# Patient Record
Sex: Female | Born: 2000 | Race: White | Hispanic: No | Marital: Single | Smoking: Current every day smoker
Health system: Southern US, Community
[De-identification: ages and names within clinical notes are randomized; demographics above are authoritative.]

## PROBLEM LIST (undated history)

## (undated) ENCOUNTER — Inpatient Hospital Stay (HOSPITAL_COMMUNITY): Payer: Self-pay

## (undated) DIAGNOSIS — F639 Impulse disorder, unspecified: Secondary | ICD-10-CM

## (undated) DIAGNOSIS — F1310 Sedative, hypnotic or anxiolytic abuse, uncomplicated: Secondary | ICD-10-CM

## (undated) DIAGNOSIS — F919 Conduct disorder, unspecified: Secondary | ICD-10-CM

## (undated) DIAGNOSIS — K219 Gastro-esophageal reflux disease without esophagitis: Secondary | ICD-10-CM

## (undated) DIAGNOSIS — R569 Unspecified convulsions: Secondary | ICD-10-CM

## (undated) DIAGNOSIS — R1110 Vomiting, unspecified: Secondary | ICD-10-CM

## (undated) DIAGNOSIS — A749 Chlamydial infection, unspecified: Secondary | ICD-10-CM

## (undated) DIAGNOSIS — F1210 Cannabis abuse, uncomplicated: Secondary | ICD-10-CM

## (undated) DIAGNOSIS — A549 Gonococcal infection, unspecified: Secondary | ICD-10-CM

## (undated) HISTORY — PX: OTHER SURGICAL HISTORY: SHX169

---
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
## 2018-01-14 ENCOUNTER — Encounter: Payer: Self-pay | Admitting: Student

## 2018-01-21 ENCOUNTER — Encounter: Payer: Self-pay | Admitting: Certified Nurse Midwife

## 2018-01-21 ENCOUNTER — Ambulatory Visit (INDEPENDENT_AMBULATORY_CARE_PROVIDER_SITE_OTHER): Payer: Medicaid Other | Admitting: Certified Nurse Midwife

## 2018-01-21 DIAGNOSIS — Z3483 Encounter for supervision of other normal pregnancy, third trimester: Secondary | ICD-10-CM

## 2018-01-21 DIAGNOSIS — Z3480 Encounter for supervision of other normal pregnancy, unspecified trimester: Secondary | ICD-10-CM

## 2018-01-21 NOTE — Progress Notes (Signed)
   PRENATAL VISIT NOTE  Subjective:  [name] is a 17 y.o. G2P1001 at 9647w3d being seen today for ongoing prenatal care.  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  Patient reports no complaints.  Contractions: Not present. Vag. Bleeding: None.  Movement: Present. Denies leaking of fluid.   The following portions of the patient's history were reviewed and updated as appropriate: allergies, current medications, past family history, past medical history, past social history, past surgical history and problem list. Problem list updated.  Objective:   Vitals:   01/21/18 1456 01/21/18 1502  BP: (!) 143/90 125/77  Pulse: 90 (!) 103  Weight: 163 lb (73.9 kg)     Fetal Status: Fetal Heart Rate (bpm): 143 Fundal Height: 31 cm Movement: Present     General:  Alert, oriented and cooperative. Patient is in no acute distress.  Skin: Skin is warm and dry. No rash noted.   Cardiovascular: Normal heart rate noted  Respiratory: Normal respiratory effort, no problems with respiration noted  Abdomen: Soft, gravid, appropriate for gestational age.  Pain/Pressure: Absent     Pelvic: Cervical exam deferred        Extremities: Normal range of motion.  Edema: None  Mental Status:  Normal mood and affect. Normal behavior. Normal judgment and thought content.   Assessment and Plan:  Pregnancy: G2P1001 at 7447w3d  1. Supervision of other normal pregnancy, antepartum -Pt doing well with no complaints  -Anticipatory guidance on next prenatal visits  -Answered patient's questions on growth and weight. Discussed that baby  is measuring appropriately and no follow up US is needed at this time.   Preterm labor symptoms and general obstetric precautions including but not limited to vaginal bleeding, contractions, leaking of fluid and fetal movement were reviewed in detail with the patient. Please refer to After Visit Summary for other counseling recommendations.  Return in about 2 weeks (around 02/04/2018) for ROB.   Sharyon CableVeronica C Valaria Kohut, CNM

## 2018-01-21 NOTE — Patient Instructions (Signed)

## 2018-01-28 ENCOUNTER — Ambulatory Visit (INDEPENDENT_AMBULATORY_CARE_PROVIDER_SITE_OTHER): Payer: Medicaid Other | Admitting: Obstetrics and Gynecology

## 2018-01-28 VITALS — BP 117/73 | HR 109 | Wt 167.1 lb

## 2018-01-28 DIAGNOSIS — Z3483 Encounter for supervision of other normal pregnancy, third trimester: Secondary | ICD-10-CM

## 2018-01-28 DIAGNOSIS — Z3480 Encounter for supervision of other normal pregnancy, unspecified trimester: Secondary | ICD-10-CM

## 2018-01-28 NOTE — Progress Notes (Signed)
   PRENATAL VISIT NOTE  Subjective:  Burman BlacksmithCheyenne Nicole Pasquarello is a 17 y.o. G2P1001 at 6518w3d being seen today for ongoing prenatal care.  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  Patient reports no complaints.  Contractions: Irritability. Vag. Bleeding: None.  Movement: Present. Denies leaking of fluid. States mood is stable. Breast fed her 17 yo for 3 months and plans to breastfeed this time.   The following portions of the patient's history were reviewed and updated as appropriate: allergies, current medications, past family history, past medical history, past social history, past surgical history and problem list. Problem list updated.  Objective:   Vitals:   01/28/18 1459  BP: 117/73  Pulse: (!) 109  Weight: 167 lb 1.6 oz (75.8 kg)    Fetal Status: Fetal Heart Rate (bpm): 141   Movement: Present     General:  Alert, oriented and cooperative. Patient is in no acute distress.  Skin: Skin is warm and dry. No rash noted.   Cardiovascular: Normal heart rate noted  Respiratory: Normal respiratory effort, no problems with respiration noted  Abdomen: Soft, gravid, appropriate for gestational age.  Pain/Pressure: Present     Pelvic: Cervical exam deferred        Extremities: Normal range of motion.  Edema: None  Mental Status:  Normal mood and affect. Normal behavior. Normal judgment and thought content.   Assessment and Plan:  Pregnancy: G2P1001 at 6218w3d 1. Supervision of other normal pregnancy, antepartum     Preterm labor symptoms and general obstetric precautions including but not limited to vaginal  bleeding, contractions, leaking of fluid and fetal movement were reviewed in detail with the patient. Please refer to After Visit Summary for other counseling recommendations.  Return in about 2 weeks (around 02/11/2018).   Caren Griffinseirdre Jenniger Figiel, CNM

## 2018-02-03 ENCOUNTER — Encounter (HOSPITAL_COMMUNITY): Payer: Self-pay | Admitting: *Deleted

## 2018-02-03 ENCOUNTER — Inpatient Hospital Stay (HOSPITAL_COMMUNITY)
Admission: AD | Admit: 2018-02-03 | Discharge: 2018-02-05 | DRG: 807 | Disposition: A | Discharge status: Home / Self Care | Payer: Medicaid Other | Source: Ambulatory Visit | Attending: Obstetrics & Gynecology | Admitting: Obstetrics & Gynecology

## 2018-02-03 ENCOUNTER — Other Ambulatory Visit: Payer: Self-pay

## 2018-02-03 ENCOUNTER — Inpatient Hospital Stay (HOSPITAL_COMMUNITY): Payer: Medicaid Other | Admitting: Anesthesiology

## 2018-02-03 DIAGNOSIS — O99824 Streptococcus B carrier state complicating childbirth: Secondary | ICD-10-CM | POA: Diagnosis present

## 2018-02-03 DIAGNOSIS — Z3A35 35 weeks gestation of pregnancy: Secondary | ICD-10-CM

## 2018-02-03 DIAGNOSIS — O42919 Preterm premature rupture of membranes, unspecified as to length of time between rupture and onset of labor, unspecified trimester: Secondary | ICD-10-CM | POA: Diagnosis present

## 2018-02-03 DIAGNOSIS — Z87891 Personal history of nicotine dependence: Secondary | ICD-10-CM | POA: Diagnosis not present

## 2018-02-03 DIAGNOSIS — O9962 Diseases of the digestive system complicating childbirth: Secondary | ICD-10-CM | POA: Diagnosis present

## 2018-02-03 DIAGNOSIS — O42913 Preterm premature rupture of membranes, unspecified as to length of time between rupture and onset of labor, third trimester: Secondary | ICD-10-CM | POA: Diagnosis present

## 2018-02-03 DIAGNOSIS — F1210 Cannabis abuse, uncomplicated: Secondary | ICD-10-CM

## 2018-02-03 DIAGNOSIS — O42013 Preterm premature rupture of membranes, onset of labor within 24 hours of rupture, third trimester: Secondary | ICD-10-CM | POA: Diagnosis not present

## 2018-02-03 DIAGNOSIS — R8271 Bacteriuria: Secondary | ICD-10-CM

## 2018-02-03 DIAGNOSIS — K219 Gastro-esophageal reflux disease without esophagitis: Secondary | ICD-10-CM | POA: Diagnosis present

## 2018-02-03 LAB — RAPID URINE DRUG SCREEN, HOSP PERFORMED
Amphetamines: NOT DETECTED
Barbiturates: NOT DETECTED
Benzodiazepines: NOT DETECTED
Cocaine: NOT DETECTED
OPIATES: NOT DETECTED
TETRAHYDROCANNABINOL: NOT DETECTED

## 2018-02-03 LAB — TYPE AND SCREEN
ABO/RH(D): O POS
Antibody Screen: NEGATIVE

## 2018-02-03 LAB — CBC
HCT: 37.4 % (ref 36.0–46.0)
HEMOGLOBIN: 13.3 g/dL (ref 12.0–15.0)
MCH: 30.7 pg (ref 26.0–34.0)
MCHC: 35.6 g/dL (ref 30.0–36.0)
MCV: 86.4 fL (ref 78.0–100.0)
PLATELETS: 195 10*3/uL (ref 150–400)
RBC: 4.33 MIL/uL (ref 3.87–5.11)
RDW: 13.5 % (ref 11.5–15.5)
WBC: 14 10*3/uL — ABNORMAL HIGH (ref 4.0–10.5)

## 2018-02-03 LAB — RPR: RPR: NONREACTIVE

## 2018-02-03 LAB — POCT FERN TEST: POCT Fern Test: POSITIVE

## 2018-02-03 MED ORDER — ACETAMINOPHEN 325 MG PO TABS: 650.0000 mg | ORAL_TABLET | ORAL | Status: DC | PRN

## 2018-02-03 MED ORDER — IBUPROFEN 600 MG PO TABS
600.0000 mg | ORAL_TABLET | Freq: Four times a day (QID) | ORAL | Status: DC
  Administered 2018-02-04 – 2018-02-05 (×6): 600 mg via ORAL
  Filled 2018-02-03 (×6): qty 1

## 2018-02-03 MED ORDER — LACTATED RINGERS IV SOLN
500.0000 mL | INTRAVENOUS | Status: DC | PRN
Start: 2018-02-03 — End: 2018-02-03
  Administered 2018-02-03: 1000 mL via INTRAVENOUS

## 2018-02-03 MED ORDER — ZOLPIDEM TARTRATE 5 MG PO TABS: 5.0000 mg | ORAL_TABLET | Freq: Every evening | ORAL | Status: DC | PRN

## 2018-02-03 MED ORDER — SENNOSIDES-DOCUSATE SODIUM 8.6-50 MG PO TABS
2.0000 | ORAL_TABLET | ORAL | Status: DC
  Administered 2018-02-04 – 2018-02-05 (×2): 2 via ORAL
  Filled 2018-02-03 (×3): qty 2

## 2018-02-03 MED ORDER — MEDROXYPROGESTERONE ACETATE 150 MG/ML IM SUSP
150.0000 mg | Freq: Once | INTRAMUSCULAR | Status: AC
  Administered 2018-02-05: 150 mg via INTRAMUSCULAR
  Filled 2018-02-03: qty 1

## 2018-02-03 MED ORDER — ONDANSETRON HCL 4 MG/2ML IJ SOLN: 4.0000 mg | INTRAMUSCULAR | Status: DC | PRN

## 2018-02-03 MED ORDER — LACTATED RINGERS IV SOLN
INTRAVENOUS | Status: DC
  Administered 2018-02-03 (×2): via INTRAVENOUS

## 2018-02-03 MED ORDER — ONDANSETRON HCL 4 MG PO TABS
4.0000 mg | ORAL_TABLET | ORAL | Status: DC | PRN
  Filled 2018-02-03: qty 1

## 2018-02-03 MED ORDER — ONDANSETRON HCL 4 MG/2ML IJ SOLN: 4.0000 mg | Freq: Four times a day (QID) | INTRAMUSCULAR | Status: DC | PRN

## 2018-02-03 MED ORDER — OXYCODONE-ACETAMINOPHEN 5-325 MG PO TABS: 2.0000 | ORAL_TABLET | ORAL | Status: DC | PRN

## 2018-02-03 MED ORDER — ACETAMINOPHEN 325 MG PO TABS
650.0000 mg | ORAL_TABLET | ORAL | Status: DC | PRN
  Administered 2018-02-04 – 2018-02-05 (×5): 650 mg via ORAL
  Filled 2018-02-03 (×6): qty 2

## 2018-02-03 MED ORDER — SIMETHICONE 80 MG PO CHEW: 80.0000 mg | CHEWABLE_TABLET | ORAL | Status: DC | PRN

## 2018-02-03 MED ORDER — OXYTOCIN 40 UNITS IN LACTATED RINGERS INFUSION - SIMPLE MED
1.0000 m[IU]/min | INTRAVENOUS | Status: DC
  Administered 2018-02-03: 2 m[IU]/min via INTRAVENOUS

## 2018-02-03 MED ORDER — BENZOCAINE-MENTHOL 20-0.5 % EX AERO
1.0000 "application " | INHALATION_SPRAY | CUTANEOUS | Status: DC | PRN
  Administered 2018-02-04: 1 via TOPICAL
  Filled 2018-02-03: qty 56

## 2018-02-03 MED ORDER — LIDOCAINE HCL (PF) 1 % IJ SOLN
INTRAMUSCULAR | Status: DC | PRN
  Administered 2018-02-03: 4 mL via EPIDURAL
  Administered 2018-02-03: 8 mL via EPIDURAL

## 2018-02-03 MED ORDER — PHENYLEPHRINE 40 MCG/ML (10ML) SYRINGE FOR IV PUSH (FOR BLOOD PRESSURE SUPPORT)
80.0000 ug | PREFILLED_SYRINGE | INTRAVENOUS | Status: DC | PRN
  Filled 2018-02-03: qty 5
  Filled 2018-02-03: qty 10

## 2018-02-03 MED ORDER — LACTATED RINGERS IV SOLN: 500.0000 mL | Freq: Once | INTRAVENOUS | Status: DC

## 2018-02-03 MED ORDER — DIPHENHYDRAMINE HCL 25 MG PO CAPS: 25.0000 mg | ORAL_CAPSULE | Freq: Four times a day (QID) | ORAL | Status: DC | PRN

## 2018-02-03 MED ORDER — WITCH HAZEL-GLYCERIN EX PADS: 1.0000 "application " | MEDICATED_PAD | CUTANEOUS | Status: DC | PRN

## 2018-02-03 MED ORDER — PHENYLEPHRINE 40 MCG/ML (10ML) SYRINGE FOR IV PUSH (FOR BLOOD PRESSURE SUPPORT)
80.0000 ug | PREFILLED_SYRINGE | INTRAVENOUS | Status: DC | PRN
  Filled 2018-02-03: qty 5

## 2018-02-03 MED ORDER — COCONUT OIL OIL
1.0000 "application " | TOPICAL_OIL | Status: DC | PRN
  Administered 2018-02-04: 1 via TOPICAL
  Filled 2018-02-03: qty 120

## 2018-02-03 MED ORDER — PENICILLIN G POT IN DEXTROSE 60000 UNIT/ML IV SOLN
3.0000 10*6.[IU] | INTRAVENOUS | Status: DC
  Administered 2018-02-03 (×2): 3 10*6.[IU] via INTRAVENOUS
  Filled 2018-02-03 (×5): qty 50

## 2018-02-03 MED ORDER — FENTANYL 2.5 MCG/ML BUPIVACAINE 1/10 % EPIDURAL INFUSION (WH - ANES)
14.0000 mL/h | INTRAMUSCULAR | Status: DC | PRN
  Administered 2018-02-03: 14 mL/h via EPIDURAL
  Filled 2018-02-03: qty 100

## 2018-02-03 MED ORDER — DIBUCAINE 1 % RE OINT: 1.0000 "application " | TOPICAL_OINTMENT | RECTAL | Status: DC | PRN

## 2018-02-03 MED ORDER — OXYTOCIN 40 UNITS IN LACTATED RINGERS INFUSION - SIMPLE MED
2.5000 [IU]/h | INTRAVENOUS | Status: DC
  Filled 2018-02-03: qty 1000

## 2018-02-03 MED ORDER — EPHEDRINE 5 MG/ML INJ
10.0000 mg | INTRAVENOUS | Status: DC | PRN
  Filled 2018-02-03: qty 2

## 2018-02-03 MED ORDER — SOD CITRATE-CITRIC ACID 500-334 MG/5ML PO SOLN: 30.0000 mL | ORAL | Status: DC | PRN

## 2018-02-03 MED ORDER — BETAMETHASONE SOD PHOS & ACET 6 (3-3) MG/ML IJ SUSP
12.0000 mg | INTRAMUSCULAR | Status: DC
  Administered 2018-02-03: 12 mg via INTRAMUSCULAR
  Filled 2018-02-03 (×2): qty 2

## 2018-02-03 MED ORDER — PRENATAL MULTIVITAMIN CH
1.0000 | ORAL_TABLET | Freq: Every day | ORAL | Status: DC
  Administered 2018-02-04 – 2018-02-05 (×2): 1 via ORAL
  Filled 2018-02-03 (×2): qty 1

## 2018-02-03 MED ORDER — OXYTOCIN BOLUS FROM INFUSION
500.0000 mL | Freq: Once | INTRAVENOUS | Status: AC
  Administered 2018-02-03: 500 mL via INTRAVENOUS

## 2018-02-03 MED ORDER — DIPHENHYDRAMINE HCL 50 MG/ML IJ SOLN: 12.5000 mg | INTRAMUSCULAR | Status: DC | PRN

## 2018-02-03 MED ORDER — FLEET ENEMA 7-19 GM/118ML RE ENEM: 1.0000 | ENEMA | RECTAL | Status: DC | PRN

## 2018-02-03 MED ORDER — TETANUS-DIPHTH-ACELL PERTUSSIS 5-2.5-18.5 LF-MCG/0.5 IM SUSP: 0.5000 mL | Freq: Once | INTRAMUSCULAR | Status: DC

## 2018-02-03 MED ORDER — SODIUM CHLORIDE 0.9 % IV SOLN
5.0000 10*6.[IU] | Freq: Once | INTRAVENOUS | Status: AC
  Administered 2018-02-03: 5 10*6.[IU] via INTRAVENOUS
  Filled 2018-02-03: qty 5

## 2018-02-03 MED ORDER — LIDOCAINE HCL (PF) 1 % IJ SOLN
30.0000 mL | INTRAMUSCULAR | Status: DC | PRN
  Filled 2018-02-03: qty 30

## 2018-02-03 MED ORDER — OXYCODONE-ACETAMINOPHEN 5-325 MG PO TABS: 1.0000 | ORAL_TABLET | ORAL | Status: DC | PRN

## 2018-02-03 MED ORDER — FENTANYL CITRATE (PF) 100 MCG/2ML IJ SOLN: 100.0000 ug | INTRAMUSCULAR | Status: DC | PRN

## 2018-02-03 NOTE — MAU Provider Note (Signed)
S: Ms. Kelly [name] is a 17 y.o. G2P1001 at 5765w2d  who presents to MAU today complaining of leaking of fluid since 0130. She denies vaginal bleeding. She denies contractions. She reports normal fetal movement.    O: BP 118/81 (BP Location: Left Arm)   Pulse (!) 120   Temp 98.3 F (36.8 C) (Oral)   Resp 19   Ht 5\' 6"  (1.676 m)   Wt 167 lb (75.8 kg)   LMP  (LMP Unknown)   SpO2 100%   BMI 26.95 kg/m  GENERAL: Well-developed, well-nourished female in no acute distress.  HEAD: Normocephalic, atraumatic.  CHEST: Normal effort of breathing, regular heart rate ABDOMEN: Soft, nontender, gravid PELVIC: Positive ferning by RN, large amount clear fluid noted on pt pad  Cervical exam:  Deferred  Limited OB US Date: 02/03/18 EDD : 03/08/18  based on first trimester US Fetal position: Vertex noted with cranial features identified in pelvis on today's US     Fetal Monitoring: Baseline: 135 Variability: moderate Accelerations: present Decelerations: none Contractions: every 5-7 minutes, mild to palpation  Results for orders placed or performed during the hospital encounter of 02/03/18 (from the past 24 hour(s))  Fern Test     Status: None   Collection Time: 02/03/18  4:35 AM  Result Value Ref Range   POCT Fern Test Positive = ruptured amniotic membanes      A: SIUP at 265w2d  PPROM  P: Consult neonatologist, may [facility] pt to [facility] but notify pt that baby may need to be transferred if NICU remains full today.  Discussed with pt, who is given the option to transfer now to another hospital with NICU availability. Pt prefers admission now to [facility].  Betamethasone dose ordered on admission [facility]  Hurshel PartyLeftwich-Kirby, Hamzah Savoca A, [place]M 02/03/2018 5:09 AM

## 2018-02-03 NOTE — Lactation Note (Signed)
This note was copied from a baby's chart. Lactation Consultation Note  Patient Name: Kelly [name] MVHQI'OToday's Date: 02/03/2018 Reason for consult: Initial assessment;Late-preterm 34-36.6wks;Infant < 6lbs   Baby 5 hours old.  7621w2d <  6 lbs.  Sleeping in crib.  Mother bf first child for 3 mos. and pumped. Mother recently pumped 10 ml with DEBP.  She states she knows how to hand express.  Provided her w/ colostrum containers. Reviewed LPI feeding plan. Discussed finger syringe feeding and suggest when baby wakes to give him at least 5 ml of breastmilk before latching. Call RN for assistance if needed. Limit feedings to 30 min. Recommend mother post pump 4-6 times per day for 10-20 min with DEBP on initiation setting after feeding. Give baby back volume pumped at the next feeding. Reviewed cleaning and milk storage.  Mother states she will call WIC for DEBP. Mom encouraged to feed baby 8-12 times/24 hours and with feeding cues at least q 3 hr.  Mom made aware of O/P services, breastfeeding support groups, community resources, and our phone # for post-discharge questions.       Maternal Data Has patient been taught Hand Expression?: Yes Does the patient have breastfeeding experience prior to this delivery?: Yes  Feeding Feeding Type: Breast Fed  LATCH Score                   Interventions Interventions: Breast feeding basics reviewed;DEBP  Lactation Tools Discussed/Used Pump Review: Setup, frequency, and cleaning;Milk Storage Initiated by:: Maury Dus. Rcom, RN Date initiated:: 02/03/18   Consult Status Consult Status: Follow-up Date: 02/04/18 Follow-up type: In-patient    Dahlia ByesBerkelhammer, Kelly [name] 02/03/2018, 8:52 PM

## 2018-02-03 NOTE — H&P (Signed)
Obstetric History and Physical  Kelly [name] is a 17 y.o. G2P1001 with IUP at [redacted]w[redacted]d presenting for PPROM. Patient states she has been having  none contractions even though monitor is showing patient is contracting about every 6 minutes. none vaginal bleeding, ruptured, clear fluid membranes around 0330, with active fetal movement.    Prenatal Course Source of Care: [facility] Dating: By early [place] --->  Estimated Date of Delivery: 03/08/18 Pregnancy complications or risks: Patient Active Problem List   Diagnosis Date Noted  . Supervision of other normal pregnancy, antepartum 09/29/2017  . Group B streptococcal bacteriuria 08/05/2017  . Chlamydia infection affecting pregnancy 07/13/2016  . Mood disorder (HCC)   . 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 Cannabis abuse 11/12/2015  . 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 ODD (oppositional defiant disorder) 11/11/2015   She plans to breastfeed, plans to bottle feed She desires Depo-Provera for postpartum contraception.   Sono:    @[redacted]w[redacted]d , CWD, normal anatomy, cephalic presentation, anterior placenta, 288g, 50% EFW  Prenatal labs and studies: ABO, Rh: O/Positive/-- (11/01 1049) Antibody: Negative (11/01 1049) Rubella: 4.40 (11/01 1049) RPR: Non Reactive (01/17 1017)  HBsAg: Negative (11/01 1049)  HIV: Non Reactive (01/17 1017)  GBS: Positive in urine 2 hr Glucola  Normal Genetic screening declined Anatomy US normal  Prenatal Transfer Tool  Maternal Diabetes: No Genetic Screening: Declined Maternal Ultrasounds/Referrals: Normal Fetal Ultrasounds or other Referrals:  None Maternal Substance Abuse:  Yes:  Type: Marijuana Significant Maternal Medications:  None Significant Maternal Lab Results: Lab values include: Group B Strep positive  Past Medical History:  Diagnosis Date  . 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 Cannabis abuse 11/12/2015  . Chlamydia   .  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 GERD (gastroesophageal reflux disease)   . Gonorrhea   . Seizures [facility])    one seizure August 2016  . Vomiting     Past Surgical History:  Procedure Laterality Date  . tubes in ears      OB History  Gravida Para Term Preterm AB Living  2 1 1  0 0 1  SAB TAB Ectopic Multiple Live Births  0 0 0 0 1    # Outcome Date GA Lbr Len/2nd Weight Sex Delivery Anes PTL Lv  2 Current           1 Term 01/16/17 [redacted]w[redacted]d 15:00 / 01:56 3.351 kg (7 lb 6.2 oz) F Vag-Spont EPI, Local  LIV      Social History   Socioeconomic History  . Marital status: Single    Spouse name: None  . Number of children: None  . Years of education: None  . Highest education level: None  Social Needs  . Financial resource strain: None  . Food insecurity - worry: None  . Food insecurity - inability: None  . Transportation needs - medical: None  . Transportation needs - non-medical: None  Occupational History  . None  Tobacco Use  . Smoking status: Former Smoker    Types: Cigarettes    Last attempt to quit: 05/25/2016    Years since quitting: 1.6  . Smokeless tobacco: Never Used  Substance and Sexual Activity  . Alcohol use: No  . Drug use: No    Comment: "I haven't smoked in a week"  . Sexual activity: Yes    Birth control/protection: None  Other Topics Concern  . None  Social History Narrative  .  None    Family History  Problem Relation Age of Onset  . Cancer Maternal Uncle     Medications Prior to Admission  Medication Sig Dispense Refill Last Dose  . Prenatal Vit-Fe Fumarate-FA (PREPLUS) 27-1 MG TABS Take 1 tablet by mouth daily. 30 tablet 13 Past Month at Unknown time    No Known Allergies  Review of Systems: Negative except for what is mentioned in HPI.  Physical Exam: BP 118/81 (BP Location: Left Arm)   Pulse (!) 120   Temp 98.3 F (36.8 C) (Oral)   Resp 19   Ht 5\' 6"  (1.676 m)   Wt  75.8 kg (167 lb)   LMP  (LMP Unknown)   SpO2 100%   BMI 26.95 kg/m  CONSTITUTIONAL: Well-developed, well-nourished female in no acute distress.  HENT:  Normocephalic, atraumatic, External right and left ear normal. Oropharynx is clear and moist EYES: Conjunctivae and EOM are normal. Pupils are equal, round, and reactive to light. No scleral icterus.  NECK: Normal range of motion, supple, no masses SKIN: Skin is warm and dry. No rash noted. Not diaphoretic. No erythema. No pallor. NEUROLOGIC: Alert and oriented to person, place, and time. Normal reflexes, muscle tone coordination. No cranial nerve deficit noted. PSYCHIATRIC: Normal mood and affect. Normal behavior. Normal judgment and thought content. CARDIOVASCULAR: Normal heart rate noted, regular rhythm RESPIRATORY: Effort and breath sounds normal, no problems with respiration noted ABDOMEN: Soft, nontender, nondistended, gravid. MUSCULOSKELETAL: Normal range of motion. No edema and no tenderness. 2+ distal pulses.  Cervical Exam: Dilatation 3.5cm   Effacement 80%   Station -2   Presentation: cephalic FHT:  Baseline rate 125 bpm   Variability moderate  Accelerations present   Decelerations none Contractions: Every 6 mins   Pertinent Labs/Studies:   Results for orders placed or performed during the hospital encounter of 02/03/18 (from the past 24 hour(s))  Fern Test     Status: None   Collection Time: 02/03/18  4:35 AM  Result Value Ref Range   POCT Fern Test Positive = ruptured amniotic membanes     Assessment : Kelly [name] is a 17 y.o. G2P1001 at 6861w2d being admitted for PPROM at 470330. Early labor.  Plan: Labor: Expectant management. Will wait to augment after dose of BMZ given. Patient having regular contractions.  Analgesia as needed. FWB: Reassuring fetal heart tracing.  GBS positive - will start PCN Delivery plan: Hopeful for vaginal delivery   Caryl AdaJazma Phelps, DO OB Fellow Faculty Practice, [facility]  - [place] 02/03/2018, 5:14 AM

## 2018-02-03 NOTE — Anesthesia Pain Management Evaluation Note (Signed)
  CRNA Pain Management Visit Note  Patient: Kelly [name], 17 y.o., female  "Hello I am a [facility] of the anesthesia [facility]. We have an anesthesia team available at all times to provide care throughout the hospital, including epidural management and anesthesia for C-section. I don't know your plan for the delivery whether it a natural birth, water birth, IV sedation, nitrous supplementation, doula or epidural, but we want to meet your pain goals."   1.Was your pain managed to your expectations on prior hospitalizations?   Yes   2.What is your expectation for pain management during this hospitalization?     Labor support without medications  3.How can we help you reach that goal?   Record the patient's initial score and the patient's pain goal.   Pain: 3  Pain Goal: 8 [facility] wants you to be able to say your pain was always managed very well.  Laban EmperorMalinova,Ross Bender Hristova 02/03/2018

## 2018-02-03 NOTE — Anesthesia Procedure Notes (Signed)
Epidural Patient location during procedure: OB Start time: 02/03/2018 1:57 PM End time: 02/03/2018 2:01 PM  Staffing Anesthesiologist: Beryle LatheBrock, Thomas E, MD Performed: anesthesiologist   Preanesthetic Checklist Completed: patient identified, pre-op evaluation, timeout performed, IV checked, risks and benefits discussed and monitors and equipment checked  Epidural Patient position: sitting Prep: DuraPrep Patient monitoring: continuous pulse ox and blood pressure Approach: midline Location: L2-L3 Injection technique: LOR saline  Needle:  Needle type: Tuohy  Needle gauge: 17 G Needle length: 9 cm Needle insertion depth: 6 cm Catheter size: 19 Gauge Catheter at skin depth: 12 cm Test dose: negative and Other (1% lidocaine)  Additional Notes Patient identified. Risks including, but not limited to, bleeding, infection, nerve damage, paralysis, inadequate analgesia, blood pressure changes, nausea, vomiting, allergic reaction, postpartum back pain, itching, and headache were discussed. Patient expressed understanding and wished to proceed. Sterile prep and drape, including hand hygiene, mask, and sterile gloves were used. The patient was positioned and the spine was prepped. The skin was anesthetized with lidocaine. No paraesthesia or other complication noted. The patient did not experience any signs of intravascular injection such as tinnitus or metallic taste in mouth, nor signs of intrathecal spread such as rapid motor block. Please see nursing notes for vital signs. The patient tolerated the procedure well.   Leslye Peerhomas Brock, MDReason for block:procedure for pain

## 2018-02-03 NOTE — Anesthesia Preprocedure Evaluation (Signed)
Anesthesia Evaluation  Patient identified by MRN, date of birth, ID band Patient awake    Reviewed: Allergy & Precautions, NPO status , Patient's Chart, lab work & pertinent test results  History of Anesthesia Complications Negative for: history of anesthetic complications  Airway Mallampati: II  TM Distance: >3 FB Neck ROM: Full    Dental  (+) Poor Dentition, Dental Advisory Given   Pulmonary former smoker,    breath sounds clear to auscultation       Cardiovascular negative cardio ROS   Rhythm:Regular Rate:Normal     Neuro/Psych Seizures -,  PSYCHIATRIC DISORDERS Oppositional Defiant Disorder, Mood disorder   GI/Hepatic GERD  Medicated and Controlled,(+)     substance abuse  marijuana use, Benzodiazepine abuse   Endo/Other  negative endocrine ROS  Renal/GU negative Renal ROS     Musculoskeletal   Abdominal   Peds  Hematology plt 182k   Anesthesia Other Findings   Reproductive/Obstetrics (+) Pregnancy                             Anesthesia Physical  Anesthesia Plan  ASA: II  Anesthesia Plan: Epidural   Post-op Pain Management:    Induction:   PONV Risk Score and Plan:   Airway Management Planned: Natural Airway  Additional Equipment:   Intra-op Plan:   Post-operative Plan:   Informed Consent: I have reviewed the patients History and Physical, chart, labs and discussed the procedure including the risks, benefits and alternatives for the proposed anesthesia with the patient or authorized representative who has indicated his/her understanding and acceptance.   Dental advisory given  Plan Discussed with:   Anesthesia Plan Comments: (Labs reviewed. Platelets acceptable, patient not taking any blood thinning medications. Risks and benefits discussed with patient, patient expressed understanding and wished to proceed.)        Anesthesia Quick Evaluation

## 2018-02-03 NOTE — Progress Notes (Signed)
Labor Progress Note  Kelly [name] is a 17 y.o. G2P1001 with IUP at 4412w2d  admitted for PPROM.   S: She is doing well without any complaints. Pain is well controlled.   O:  BP 109/72   Pulse (!) 109   Temp 98 F (36.7 C) (Oral)   Resp 18   Ht 5\' 6"  (1.676 m)   Wt 75.8 kg (167 lb)   LMP  (LMP Unknown)   SpO2 100%   BMI 26.95 kg/m   No intake/output data recorded.  FHT:  FHR: 125 bpm, variability: moderate,  accelerations:  Present,  decelerations:  Absent UC:   irregular, every 5-9 minutes SVE:   Dilation: 4 Effacement (%): 70 Station: -2 Exam by:: AWallace Cullens. Gray RN  SROM: clear fluid @ 0330 02/03/18  Pitocin @ 4 mu/min  Labs: Lab Results  Component Value Date   WBC 14.0 (H) 02/03/2018   HGB 13.3 02/03/2018   HCT 37.4 02/03/2018   MCV 86.4 02/03/2018   PLT 195 02/03/2018    Assessment / Plan: 17 y.o. G2P1001 5112w2d with IUP at 4812w2d gestation with PPROM.  Labor: Progressing on Pitocin Fetal Wellbeing:  Category I Pain Control:  Epidural Anticipated MOD:  NSVD  Expectant management   Lyn RecordsStephanie Ryanne Panfilo Ketchum MS3

## 2018-02-03 NOTE — Progress Notes (Signed)
Labor Progress Note Burman BlacksmithCheyenne Nicole Nebergall is a 17 y.o. G2P1001 at 133w2d presented for PPROM.  S: Patient uncomfortable with contractions, requesting epidural.  O:  BP 112/69   Pulse (!) 105   Temp 98.3 F (36.8 C) (Oral)   Resp 18   Ht 5\' 6"  (1.676 m)   Wt 167 lb (75.8 kg)   LMP  (LMP Unknown)   SpO2 100%   BMI 26.95 kg/m   OZH:YQMVHQIOFHT:baseline rate 130, moderate variability, + acels, few early decels Toco: ctx every 3 min  CVE: Dilation: 5 Effacement (%): 90 Station: -1 Presentation: Vertex Exam by:: Henderson NewcomerStephanie Faulk RN  A&P: 17 y.o. G2P1001 6033w2d here for PPROM. #Labor: Progressing well. Continue Pitocin and titrate as needed. #Pain: Will get epidural #FWB: Cat I #GBS positive, on PCN  Ellwood DenseAlison Rumball, DO 1:48 PM

## 2018-02-03 NOTE — MAU Note (Addendum)
Pt presents via EMS with reports that her water broke at 0130-clear. Pt denies contractions, vaginal bleeding. Reports good fetal movement.

## 2018-02-04 NOTE — Anesthesia Postprocedure Evaluation (Signed)
Anesthesia Post Note  Patient: Kelly [name]  Procedure(s) Performed: AN AD HOC LABOR EPIDURAL     Patient location during evaluation: Mother Baby Anesthesia Type: Epidural Level of consciousness: awake and alert Pain management: pain level controlled Vital Signs Assessment: post-procedure vital signs reviewed and stable Respiratory status: spontaneous breathing, nonlabored ventilation and respiratory function stable Cardiovascular status: stable Postop Assessment: no headache, no backache and epidural receding Anesthetic complications: no    Last Vitals:  Vitals:   02/03/18 2200 02/04/18 0530  BP: (!) 109/53 116/65  Pulse: 83 74  Resp: 18 18  Temp: 36.7 C 36.7 C  SpO2: 99% 98%    Last Pain:  Vitals:   02/04/18 0530  TempSrc: Oral  PainSc: 0-No pain   Pain Goal: Patients Stated Pain Goal: 3 (02/03/18 1420)               Lovell Roe

## 2018-02-04 NOTE — Clinical Social Work Maternal (Signed)
CLINICAL SOCIAL WORK MATERNAL/CHILD NOTE  Patient Details  Name: Kelly [name] MRN: [id number] Date of Birth: 06/18/2001  Date:  12-26-17  Clinical Social Worker Initiating Note:  Laurey Arrow Date/Time: Initiated:  02/04/18/1536     Child's Name:  Kelly [name]   Biological Parents:  Mother, Father(FOB is Kelly [name] 12/08/1994)   Need for Interpreter:  None   Reason for Referral:  Current Substance Use/Substance Use During Pregnancy , Behavioral Health Concerns(MOB has a Mood d/o and hx of SA)   Address:  [place] [address]    Phone number:  [phone] (home)     Additional phone number:   Household Members/Support Persons (HM/SP):   Household Member/Support Person 1(MOB lives with MOB's parents and sister. )   HM/SP Name Relationship DOB or Age  HM/SP -1 Kelly [name]  Daughter  01/16/2017  HM/SP -2        HM/SP -3        HM/SP -4        HM/SP -5        HM/SP -6        HM/SP -7        HM/SP -8          Natural Supports (not living in the home):  Spouse/significant other, Parent   Professional Supports: Other (Comment)(MOB has a Engineer, manufacturing systems. )   Employment: Unemployed   Type of Work:     Education:  9 to 11 years   Homebound arranged: No  Museum/gallery curator Resources:  [employer]   Other Resources:  Location manager provided MOB with information to apply for [employer].)   Cultural/Religious Considerations Which May Impact Care:  Per [employer] Face Sheet, MOB is Non-Denominational.   Strengths:  Pediatrician chosen, Understanding of illness, Ability to meet basic needs    Psychotropic Medications:         Pediatrician:    [facility] area  Pediatrician List:   [facility] for [place]      Pediatrician Fax Number:    Risk Factors/Current Problems:  Substance Use , Mental Health Concerns    Cognitive State:  Able  to Concentrate , Alert , Insightful , Linear Thinking , Goal Oriented    Mood/Affect:  Bright , Interested , Happy , Comfortable , Relaxed    CSW Assessment: CSW met with MOB in room 104.  When CSW arrived, MOB was alone and was bonding with infant as evidence by engaging in breastfeeding. MOB was forthcoming, easy to engage, and receptive to meeting with CSW.    CSW asked MOB about MOB's MH hx.  MOB shared that MOB was dx with a mood disorder after experimenting and being addicted to Xanax in 2016.  MOB reported using Xanax daily and engaging in illegal activities. Per MOB, MOB last use of Xanax was 12/17/2015 after being arrested and going to jail for 2 weeks. MOB reports being on probation and having random drug screenings.  MOB denied having any symptoms of depression or other disorders since discontinuing the use of Xanax. CSW offered MOB counseling resources and MOB declined. CSW provided education regarding the baby blues period vs. perinatal mood disorders, discussed treatment and gave resources for mental health follow up if concerns arise.  CSW recommends self-evaluation during the postpartum time period using the New Mom Checklist from Postpartum Progress and encouraged  MOB to contact a medical professional if symptoms are noted at any time.  MOB presented with insight and awareness and did not present with any acute symptoms.   CSW asked MOB about her marijuana use and MOB admitted to using marijuana recreationally.  MOB reported MOB's last use was November 2018. CSW reviewed hospital drug policy and MOB was understanding.  CSW made MOB aware that infant's UDS was negative and CSW will continue to monitor infant's CDS.  MOB is also aware that infant's results are positive without an explanation CSW will make a report to [place]; MOB denied a CPS hx.  CSW offered MOB SA resources and MOB declined.    CSW provided review of Sudden Infant Death Syndrome (SIDS) precautions.     MOB reported that MOB's mother will be purchasing a car seat and bassinet prior to infant's d/c.  CSW explained the baby box program and encouraged MOB to reach out to bedside nurse if a need arises.   CSW Plan/Description:  Perinatal Mood and Anxiety Disorder (PMADs) Education, Other Patient/Family Education, [place], CSW Will Continue to Monitor Umbilical Cord Tissue Drug Screen Results and Make Report if Warranted, No Further Intervention Required/No Barriers to Discharge, Sudden Infant Death Syndrome (SIDS) Education, Other Information/Referral to Kelly [name], MSW, [employer] Social Work [phone]  Dimple Nanas, LCSW 02/04/2018, 3:40 PM

## 2018-02-04 NOTE — Progress Notes (Signed)
POSTPARTUM PROGRESS NOTE  Post Partum Day 1 Subjective:  Kelly [name] is a 17 y.o. G2P1001 5712w3d s/p SVD PPD#1.  No acute events overnight.  Pt denies problems with ambulating, voiding or po intake. She reports some nausea but denies vomiting.  Pain is well controlled. Lochia Minimal.   Objective: Blood pressure 116/65, pulse 74, temperature 98 F (36.7 C), temperature source Oral, resp. rate 18, height 5\' 6"  (1.676 m), weight 75.8 kg (167 lb), SpO2 98 %, currently breastfeeding.  Physical Exam:  General: alert, cooperative and no distress Lochia:normal flow Chest: no respiratory distress Heart:regular rate, distal pulses intact Abdomen: soft, nontender,  Uterine Fundus: firm, appropriately tender DVT Evaluation: No calf swelling or tenderness Extremities: no edema  Recent Labs    02/03/18 0520  HGB 13.3  HCT 37.4    Assessment/Plan:  ASSESSMENT: Kelly [name] is a 17 y.o. G2P1001 6212w3d s/p SVD. She is doing well with no complaints.   Plan for discharge tomorrow MOF: breast MOC: Depo prior to d/c   LOS: 1 day   Lillette BoxerStephanie R Byrne Capek 02/04/2018, 7:25 AM

## 2018-02-05 ENCOUNTER — Encounter (HOSPITAL_COMMUNITY): Payer: Self-pay | Admitting: *Deleted

## 2018-02-05 MED ORDER — IBUPROFEN 600 MG PO TABS: 600.0000 mg | ORAL_TABLET | Freq: Four times a day (QID) | ORAL | 0 refills | Status: DC

## 2018-02-05 NOTE — Lactation Note (Addendum)
This note was copied from a baby's chart. Lactation Consultation Note  Patient Name: Kelly [name] [id number]'s Date: 02/05/2018 Reason for consult: Follow-up assessment   LPI < 6 lbs.  Difficulty sustaining latch. Mother wants to pump and bottle feed. Mother pumping 40 ml + with DEBP.  [facility] and mother will decide. Provided mother with manual pump and increased flange size to 30. Mother using coconut oil for flanges.  Suggest trying the smaller size with coconut oil because #30 may be too big. Mom encouraged to feed baby 8-12 times/24 hours and with feeding cues.  Reviewed engorgement care and monitoring voids/stools.  Mother called to requested [facility].  Reviewed pump parts and return date.    Maternal Data    Feeding Feeding Type: Breast Milk Nipple Type: Slow - flow  LATCH Score                   Interventions    Lactation Tools Discussed/Used     Consult Status Consult Status: Complete    Hardie PulleyBerkelhammer, Ruth Boschen 02/05/2018, 8:34 AM

## 2018-02-05 NOTE — Discharge Summary (Signed)
OB Discharge Summary    Patient Name: Kelly [name] DOB: 2001/11/16 MRN: [id number] Date of admission: 02/03/2018  Delivering MD: Raelyn Mora )  Date of discharge: 02/05/2018  Admitting diagnosis: PPROM Intrauterine pregnancy: [redacted]w[redacted]d    Secondary diagnosis:  Active Problems:   Patient Active Problem List   Diagnosis Date Noted  . Preterm premature rupture of membranes (PPROM) with unknown onset of labor 02/03/2018  . Supervision of other normal pregnancy, antepartum 09/29/2017  . Group B streptococcal bacteriuria 08/05/2017  . Chlamydia infection affecting pregnancy 07/13/2016  . Mood disorder (HCC)   . 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 Cannabis abuse 11/12/2015  . 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 ODD (oppositional defiant disorder) 11/11/2015   Additional problems:      Discharge diagnosis: Term Pregnancy Delivered                                                                                                Post partum procedures:None  Complications: None  Hospital course:  Induction of Labor With Vaginal Delivery   17 y.o. yo G2P1001 at [redacted]w[redacted]d was admitted to the hospital 02/03/2018 for induction of labor.  Indication for induction:PPROM.  Patient had an uncomplicated labor course as follows: Membrane Rupture Time/Date: 3:30 AM ,02/03/2018   Intrapartum Procedures: Episiotomy: None [1]                                         Lacerations:  None [1]  Patient had delivery of a Viable infant.  Information for the patient's newborn:  Brealynn, Contino [[id number]]  Delivery Method: Vag-Spont   02/03/2018  Details of delivery can be found in separate delivery note.  Patient had a routine postpartum course. Patient is discharged home 02/05/18.  Physical exam  Vitals:   02/04/18 1835 02/05/18 0550  BP: 123/73 116/72  Pulse: 86 73  Resp: 18 18  Temp: 98.6 F (37 C) (!) 97.5 F (36.4 C)  SpO2:   98%   General: alert, cooperative and no distress Lochia: appropriate Uterine Fundus: firm Incision: N/A DVT Evaluation: No evidence of DVT seen on physical exam.  Labs: No results found for this or any previous visit (from the past 24 hour(s)).  Discharge instruction: per After Visit Summary and "Baby and Me Booklet".  After visit meds:  No Known Allergies  Allergies as of 02/05/2018   No Known Allergies     Medication List    TAKE these medications   calcium carbonate 500 MG chewable tablet Commonly known as:  TUMS - dosed in mg elemental calcium Chew 1-2 tablets by mouth daily.   ibuprofen 600 MG tablet Commonly known as:  ADVIL,MOTRIN Take 1 tablet (600 mg total) by mouth every 6 (six) hours.   PREPLUS 27-1 MG Tabs Take 1 tablet by mouth daily.      Diet: routine diet  Activity: Advance as tolerated. Pelvic rest for 6 weeks.   Outpatient follow up:4 weeks Future  Appointments:  Future Appointments  Date Time Provider Department Center  03/17/2018  2:15 PM Marylene LandKooistra, Kathryn Lorraine, CNM [facility]    Follow up Appt: No Follow-up on file.  Postpartum contraception: Depo Provera  Newborn Data: APGAR (1 MIN): 9   APGAR (5 MINS): 9    Baby Feeding: Breast Disposition:home with mother  Ellwood Denselison Montoya Watkin, DO 02/05/2018

## 2018-02-05 NOTE — Discharge Instructions (Signed)
Vaginal Delivery, Care After °Refer to this sheet in the next few weeks. These instructions provide you with information about caring for yourself after vaginal delivery. Your health care provider may also give you more specific instructions. Your treatment has been planned according to current medical practices, but problems sometimes occur. Call your health care provider if you have any problems or questions. °What can I expect after the procedure? °After vaginal delivery, it is common to have: °· Some bleeding from your vagina. °· Soreness in your abdomen, your vagina, and the area of skin between your vaginal opening and your anus (perineum). °· Pelvic cramps. °· Fatigue. ° °Follow these instructions at home: °Medicines °· Take over-the-counter and prescription medicines only as told by your health care provider. °· If you were prescribed an antibiotic medicine, take it as told by your health care provider. Do not stop taking the antibiotic until it is finished. °Driving ° °· Do not drive or operate heavy machinery while taking prescription pain medicine. °· Do not drive for 24 hours if you received a sedative. °Lifestyle °· Do not drink alcohol. This is especially important if you are breastfeeding or taking medicine to relieve pain. °· Do not use tobacco products, including cigarettes, chewing tobacco, or e-cigarettes. If you need help quitting, ask your health care provider. °Eating and drinking °· Drink at least 8 eight-ounce glasses of water every day unless you are told not to by your health care provider. If you choose to breastfeed your baby, you may need to drink more water than this. °· Eat high-fiber foods every day. These foods may help prevent or relieve constipation. High-fiber foods include: °? Whole grain cereals and breads. °? Brown rice. °? Beans. °? Fresh fruits and vegetables. °Activity °· Return to your normal activities as told by your health care provider. Ask your health care provider  what activities are safe for you. °· Rest as much as possible. Try to rest or take a nap when your baby is sleeping. °· Do not lift anything that is heavier than your baby or 10 lb (4.5 kg) until your health care provider says that it is safe. °· Talk with your health care provider about when you can engage in sexual activity. This may depend on your: °? Risk of infection. °? Rate of healing. °? Comfort and desire to engage in sexual activity. °Vaginal Care °· If you have an episiotomy or a vaginal tear, check the area every day for signs of infection. Check for: °? More redness, swelling, or pain. °? More fluid or blood. °? Warmth. °? Pus or a bad smell. °· Do not use tampons or douches until your health care provider says this is safe. °· Watch for any blood clots that may pass from your vagina. These may look like clumps of dark red, brown, or black discharge. °General instructions °· Keep your perineum clean and dry as told by your health care provider. °· Wear loose, comfortable clothing. °· Wipe from front to back when you use the toilet. °· Ask your health care provider if you can shower or take a bath. If you had an episiotomy or a perineal tear during labor and delivery, your health care provider may tell you not to take baths for a certain length of time. °· Wear a bra that supports your breasts and fits you well. °· If possible, have someone help you with household activities and help care for your baby for at least a few days after   you leave the hospital. °· Keep all follow-up visits for you and your baby as told by your health care provider. This is important. °Contact a health care provider if: °· You have: °? Vaginal discharge that has a bad smell. °? Difficulty urinating. °? Pain when urinating. °? A sudden increase or decrease in the frequency of your bowel movements. °? More redness, swelling, or pain around your episiotomy or vaginal tear. °? More fluid or blood coming from your episiotomy or  vaginal tear. °? Pus or a bad smell coming from your episiotomy or vaginal tear. °? A fever. °? A rash. °? Little or no interest in activities you used to enjoy. °? Questions about caring for yourself or your baby. °· Your episiotomy or vaginal tear feels warm to the touch. °· Your episiotomy or vaginal tear is separating or does not appear to be healing. °· Your breasts are painful, hard, or turn red. °· You feel unusually sad or worried. °· You feel nauseous or you vomit. °· You pass large blood clots from your vagina. If you pass a blood clot from your vagina, save it to show to your health care provider. Do not flush blood clots down the toilet without having your health care provider look at them. °· You urinate more than usual. °· You are dizzy or light-headed. °· You have not breastfed at all and you have not had a menstrual period for 12 weeks after delivery. °· You have stopped breastfeeding and you have not had a menstrual period for 12 weeks after you stopped breastfeeding. °Get help right away if: °· You have: °? Pain that does not go away or does not get better with medicine. °? Chest pain. °? Difficulty breathing. °? Blurred vision or spots in your vision. °? Thoughts about hurting yourself or your baby. °· You develop pain in your abdomen or in one of your legs. °· You develop a severe headache. °· You faint. °· You bleed from your vagina so much that you fill two sanitary pads in one hour. °This information is not intended to replace advice given to you by your health care provider. Make sure you discuss any questions you have with your health care provider. °Document Released: 11/13/2000 Document Revised: 04/29/2016 Document Reviewed: 12/01/2015 °Elsevier Interactive Patient Education © 2018 [employer] ° °

## 2018-02-08 ENCOUNTER — Telehealth: Payer: Self-pay | Admitting: Licensed Clinical Social Worker

## 2018-02-08 NOTE — Telephone Encounter (Signed)
CSW A.Jaquala Fuller telephone pt cell phn number female answer and reports wrong number he does not know pt.

## 2018-02-11 ENCOUNTER — Encounter: Payer: Self-pay | Admitting: Student

## 2018-02-18 ENCOUNTER — Encounter: Payer: Self-pay | Admitting: Student

## 2018-02-23 ENCOUNTER — Emergency Department (HOSPITAL_COMMUNITY): Admission: EM | Admit: 2018-02-23 | Discharge: 2018-02-23 | Discharge status: Absent without leave | Payer: Self-pay

## 2018-02-23 ENCOUNTER — Other Ambulatory Visit: Payer: Self-pay

## 2018-02-23 ENCOUNTER — Encounter (HOSPITAL_COMMUNITY): Payer: Self-pay | Admitting: Emergency Medicine

## 2018-02-23 ENCOUNTER — Emergency Department (HOSPITAL_COMMUNITY)
Admission: EM | Admit: 2018-02-23 | Discharge: 2018-02-23 | Disposition: A | Discharge status: Home / Self Care | Payer: Medicaid Other | Attending: Emergency Medicine | Admitting: Emergency Medicine

## 2018-02-23 DIAGNOSIS — L0291 Cutaneous abscess, unspecified: Secondary | ICD-10-CM

## 2018-02-23 DIAGNOSIS — L02416 Cutaneous abscess of left lower limb: Secondary | ICD-10-CM | POA: Insufficient documentation

## 2018-02-23 DIAGNOSIS — Z79899 Other long term (current) drug therapy: Secondary | ICD-10-CM | POA: Insufficient documentation

## 2018-02-23 MED ORDER — LIDOCAINE-EPINEPHRINE (PF) 2 %-1:200000 IJ SOLN
20.0000 mL | Freq: Once | INTRAMUSCULAR | Status: AC
  Administered 2018-02-23: 20 mL

## 2018-02-23 MED ORDER — DOXYCYCLINE HYCLATE 100 MG PO CAPS: 100.0000 mg | ORAL_CAPSULE | Freq: Two times a day (BID) | ORAL | 0 refills | Status: DC

## 2018-02-23 MED ORDER — LIDOCAINE-EPINEPHRINE (PF) 2 %-1:200000 IJ SOLN
INTRAMUSCULAR | Status: AC
  Administered 2018-02-23: 20 mL
  Filled 2018-02-23: qty 20

## 2018-02-23 NOTE — ED Provider Notes (Signed)
[facility]-EMERGENCY DEPT Provider Note   CSN: [id number] Arrival date & time: 02/23/18  1020     History   Chief Complaint Chief Complaint  Patient presents with  . Abscess    HPI [name] is a 17 y.o. female.  HPI  17 year old female presents the emergency department for an abscess of her anterior left thigh.  No drainage.  No fevers or chills.  Mild surrounding erythema.  History of MRSA.  No other complaints.  Past Medical History:  Diagnosis Date  . 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 Cannabis abuse 11/12/2015  . Chlamydia   . 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 GERD (gastroesophageal reflux disease)   . Gonorrhea   . Seizures [facility](HCC)    one seizure August 2016  . Vomiting     Patient Active Problem List   Diagnosis Date Noted  . Preterm premature rupture of membranes (PPROM) with unknown onset of labor 02/03/2018  . Supervision of other normal pregnancy, antepartum 09/29/2017  . Group B streptococcal bacteriuria 08/05/2017  . Chlamydia infection affecting pregnancy 07/13/2016  . Mood disorder (HCC)   . 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 Cannabis abuse 11/12/2015  . 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 ODD (oppositional defiant disorder) 11/11/2015    Past Surgical History:  Procedure Laterality Date  . tubes in ears       OB History    Gravida  2   Para  1   Term  1   Preterm  0   AB  0   Living  1     SAB  0   TAB  0   Ectopic  0   Multiple  0   Live Births  1            Home Medications    Prior to Admission medications   Medication Sig Start Date End Date Taking? Authorizing Provider  calcium carbonate (TUMS - DOSED IN MG ELEMENTAL CALCIUM) 500 MG chewable tablet Chew 1-2 tablets by mouth daily.    [provider]  doxycycline (VIBRAMYCIN) 100 MG  capsule Take 1 capsule (100 mg total) by mouth 2 (two) times daily. 02/23/18   Azalia Bilisampos, Temple Ewart, MD  ibuprofen (ADVIL,MOTRIN) 600 MG tablet Take 1 tablet (600 mg total) by mouth every 6 (six) hours. 02/05/18   Ellwood Denseumball, Alison, DO  Prenatal Vit-Fe Fumarate-FA (PREPLUS) 27-1 MG TABS Take 1 tablet by mouth daily. 08/26/17   Judeth HornLawrence, Erin, NP    Family History Family History  Problem Relation Age of Onset  . Cancer Maternal Uncle     Social History Social History   Tobacco Use  . Smoking status: Former Smoker    Types: Cigarettes    Last attempt to quit: 05/25/2016    Years since quitting: 1.7  . Smokeless tobacco: Never Used  Substance Use Topics  . Alcohol use: No  . Drug use: No    Types: Marijuana, Benzodiazepines    Comment: "I haven't smoked in a week"     Allergies   Patient has no known allergies.   Review of Systems Review of Systems  All other systems reviewed and are negative.    Physical Exam Updated Vital Signs BP 129/83 (BP Location: Left Arm)   Pulse 77   Temp 97.8 F (36.6 C) (Oral)   Resp 18   Ht 5\' 6"  (1.676 m)  Wt 70.3 kg (155 lb)   SpO2 98%   BMI 25.02 kg/m   Physical Exam  Constitutional: She is oriented to person, place, and time. She appears well-developed and well-nourished. No distress.  HENT:  Head: Normocephalic and atraumatic.  Eyes: Pupils are equal, round, and reactive to light. EOM are normal.  Neck: Normal range of motion.  Cardiovascular: Normal rate.  Pulmonary/Chest: Effort normal.  Musculoskeletal: Normal range of motion.  Superficial abscess mid anterior thigh with a small amount of surrounding erythema.  Induration and fluctuance.  No drainage.  Neurological: She is alert and oriented to person, place, and time.  5/5 strength in major muscle groups of  bilateral upper and lower extremities. Speech normal. No facial asymetry.   Skin: Skin is warm.  Psychiatric: She has a normal mood and affect. Judgment normal.  Nursing note  and vitals reviewed.    ED Treatments / Results  Labs (all labs ordered are listed, but only abnormal results are displayed) Labs Reviewed - No data to display  EKG None  Radiology No results found.  Procedures .Marland KitchenIncision and Drainage Performed by: Azalia Bilis, MD Authorized by: Azalia Bilis, MD     INCISION AND DRAINAGE Performed by: Azalia Bilis Consent: Verbal consent obtained. Risks and benefits: risks, benefits and alternatives were discussed Time out performed prior to procedure Type: abscess Body area: Left thigh Anesthesia: local infiltration Incision was made with a scalpel. Local anesthetic: lidocaine 2 % with epinephrine Anesthetic total: 4 ml Complexity: complex Blunt dissection to break up loculations Drainage: purulent Drainage amount: Small Packing material: None Patient tolerance: Patient tolerated the procedure well with no immediate complications.     Medications Ordered in ED Medications  lidocaine-EPINEPHrine (XYLOCAINE W/EPI) 2 %-1:200000 (PF) injection 20 mL (has no administration in time range)  lidocaine-EPINEPHrine (XYLOCAINE W/EPI) 2 %-1:200000 (PF) injection (has no administration in time range)     Initial Impression / Assessment and Plan / ED Course  I have reviewed the triage vital signs and the nursing notes.  Pertinent labs & imaging results that were available during my care of the patient were reviewed by me and considered in my medical decision making (see chart for details).     Incision and drainage left anterior thigh.  Home with antibiotics.  No signs of overwhelming infection.  Patient understands return to the ER for new or worsening symptoms  Final Clinical Impressions(s) / ED Diagnoses   Final diagnoses:  Abscess    ED Discharge Orders        Ordered    doxycycline (VIBRAMYCIN) 100 MG capsule  2 times daily     02/23/18 1139       Azalia Bilis, MD 02/23/18 1146

## 2018-02-23 NOTE — ED Triage Notes (Signed)
Pt complaint of boils to thigh; two have popped; one still present to left thigh.

## 2018-02-25 ENCOUNTER — Encounter: Payer: Self-pay | Admitting: Nurse Practitioner

## 2018-03-04 ENCOUNTER — Encounter: Payer: Self-pay | Admitting: Student

## 2018-03-09 ENCOUNTER — Telehealth: Payer: Self-pay | Admitting: Licensed Clinical Social Worker

## 2018-03-09 NOTE — Telephone Encounter (Signed)
CSW Kelly [name] contacted pt to confirm scheduled appt. Pt confirmed

## 2018-03-11 ENCOUNTER — Encounter: Payer: Self-pay | Admitting: Nurse Practitioner

## 2018-03-17 ENCOUNTER — Ambulatory Visit: Payer: Self-pay | Admitting: Student

## 2018-04-17 ENCOUNTER — Inpatient Hospital Stay (HOSPITAL_COMMUNITY)
Admission: AD | Admit: 2018-04-17 | Discharge: 2018-04-17 | Disposition: A | Discharge status: Home / Self Care | Payer: Medicaid Other | Source: Ambulatory Visit | Attending: Obstetrics & Gynecology | Admitting: Obstetrics & Gynecology

## 2018-04-17 NOTE — Progress Notes (Signed)
Pt called third time to be seen in triage, again not in waiting room.

## 2018-04-17 NOTE — Progress Notes (Signed)
Pt called again to be seen in triage, still not in waiting area.

## 2018-04-17 NOTE — Progress Notes (Signed)
Pt called to be seen in triage, no longer in waiting area.  Will call again in 10 minutes.

## 2018-07-25 ENCOUNTER — Inpatient Hospital Stay (HOSPITAL_COMMUNITY)
Admission: AD | Admit: 2018-07-25 | Discharge: 2018-07-25 | Disposition: A | Discharge status: Home / Self Care | Payer: Medicaid Other | Source: Ambulatory Visit | Attending: Obstetrics and Gynecology | Admitting: Obstetrics and Gynecology

## 2018-07-25 ENCOUNTER — Ambulatory Visit (INDEPENDENT_AMBULATORY_CARE_PROVIDER_SITE_OTHER): Payer: Medicaid Other

## 2018-07-25 ENCOUNTER — Other Ambulatory Visit (HOSPITAL_COMMUNITY)
Admission: RE | Admit: 2018-07-25 | Discharge: 2018-07-25 | Disposition: A | Discharge status: Home / Self Care | Payer: Medicaid Other | Source: Ambulatory Visit | Attending: Advanced Practice Midwife | Admitting: Advanced Practice Midwife

## 2018-07-25 DIAGNOSIS — Z202 Contact with and (suspected) exposure to infections with a predominantly sexual mode of transmission: Secondary | ICD-10-CM | POA: Diagnosis present

## 2018-07-25 DIAGNOSIS — Z3200 Encounter for pregnancy test, result unknown: Secondary | ICD-10-CM

## 2018-07-25 DIAGNOSIS — Z3202 Encounter for pregnancy test, result negative: Secondary | ICD-10-CM | POA: Diagnosis present

## 2018-07-25 LAB — POCT PREGNANCY, URINE: Preg Test, Ur: NEGATIVE

## 2018-07-25 NOTE — Progress Notes (Signed)
Pt here for UPT-Negative states took 2 pregnancy test at home, lines were faint.Pt also states wanted to do a STD check. Advised results will be back within 24 to 48hrs. Pt states no l;[facility], advised will resend info. Pt verbalized understanding.

## 2018-07-25 NOTE — Progress Notes (Signed)
I have reviewed the chart and agree with nursing staff's documentation of this patient's encounter.  Thressa ShellerHeather Delcia Spitzley, CNM 07/25/2018 3:21 PM

## 2018-07-26 LAB — CERVICOVAGINAL ANCILLARY ONLY
Bacterial vaginitis: NEGATIVE
CANDIDA VAGINITIS: NEGATIVE
CHLAMYDIA, DNA PROBE: NEGATIVE
Neisseria Gonorrhea: NEGATIVE
Trichomonas: NEGATIVE

## 2018-07-27 ENCOUNTER — Telehealth: Payer: Self-pay | Admitting: Family Medicine

## 2018-07-27 NOTE — Telephone Encounter (Signed)
Attempted to contact pt unable LM due to VM box not set up.  MyChart message sent.

## 2018-07-27 NOTE — Telephone Encounter (Signed)
Patient was calling for her results, and had a question about the medication. Please return the call today.

## 2018-08-12 IMAGING — US US OB COMP LESS 14 WK
1 series · 14 of 28 positions shown · non-contrast
Comparison: Ultrasound 08/03/2017

CLINICAL DATA: Pregnant patient with vaginal spotting.

EXAM:
OBSTETRIC <14 WK ULTRASOUND
TECHNIQUE: Transabdominal ultrasound was performed for evaluation of the
gestation as well as the maternal uterus and adnexal regions.

[Series 1: us ob comp less 14 wk · 0.23mm/px · 14 of 45 slices shown]
[im 2/45]
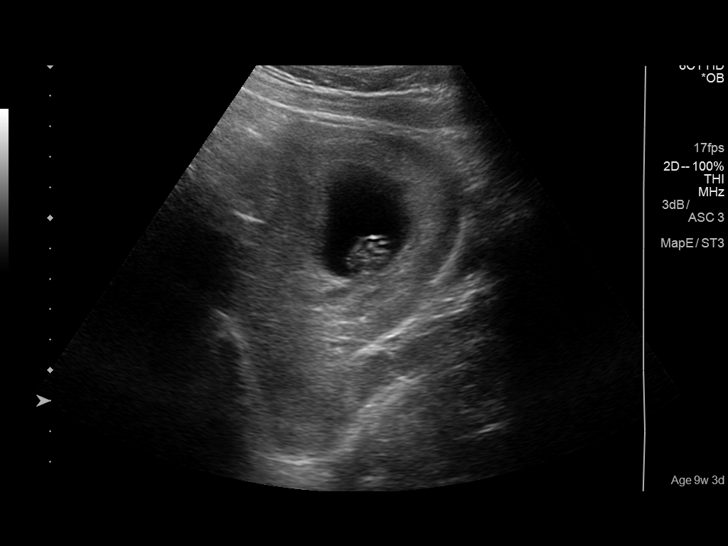
[im 5/45]
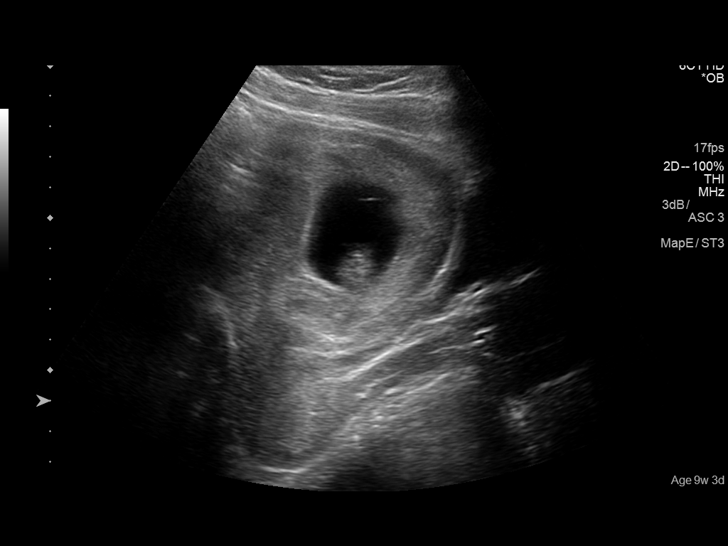
[im 9/45]
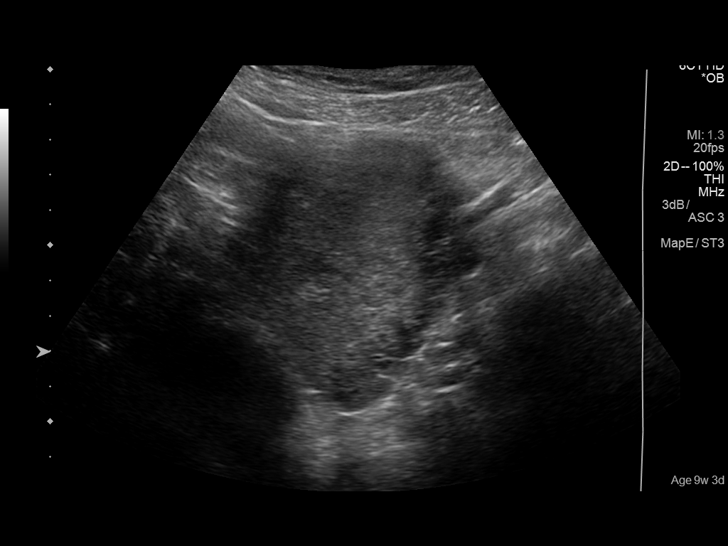
[im 12/45]
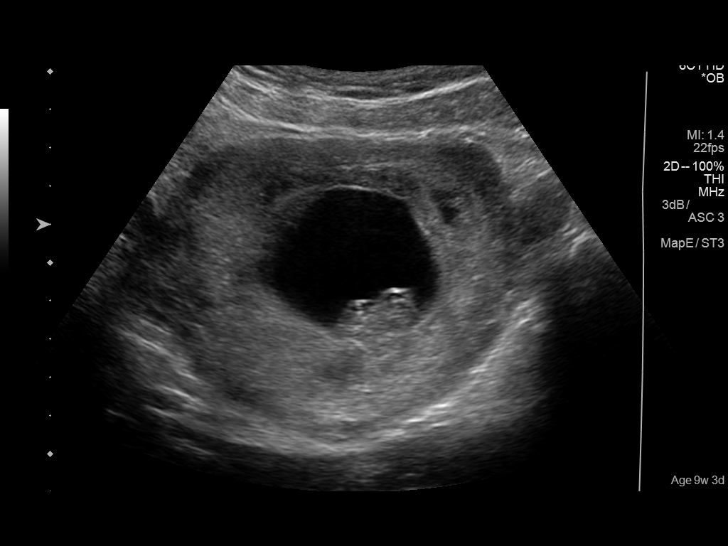
[im 15/45]
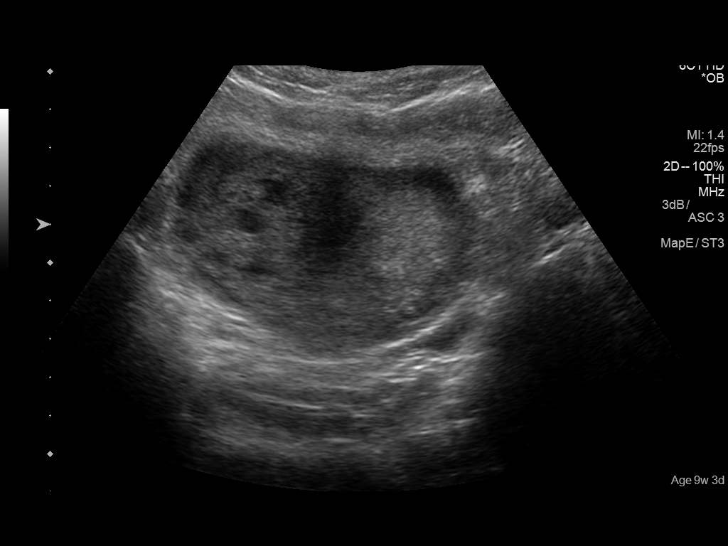
[im 18/45]
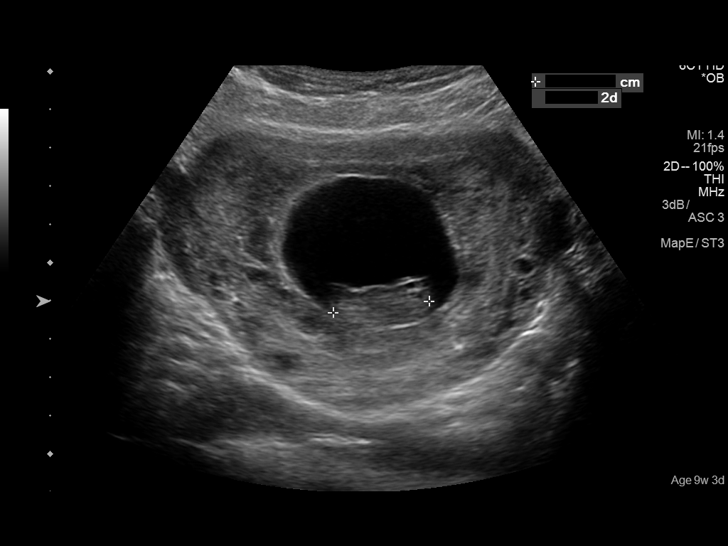
[im 22/45]
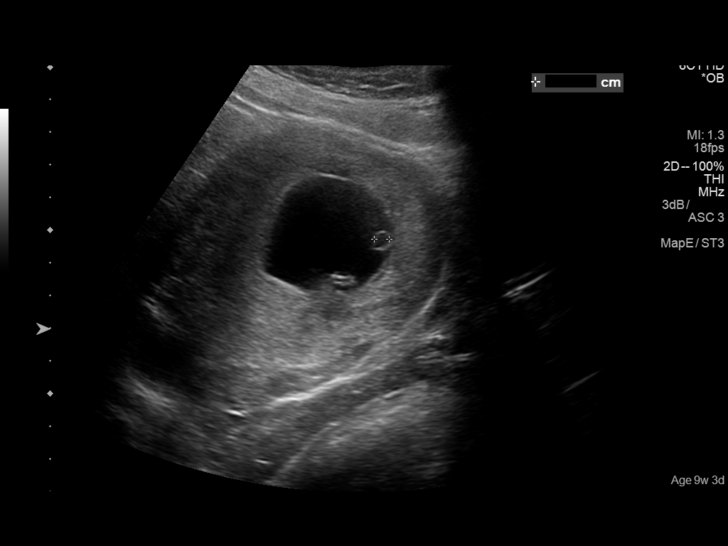
[im 25/45]
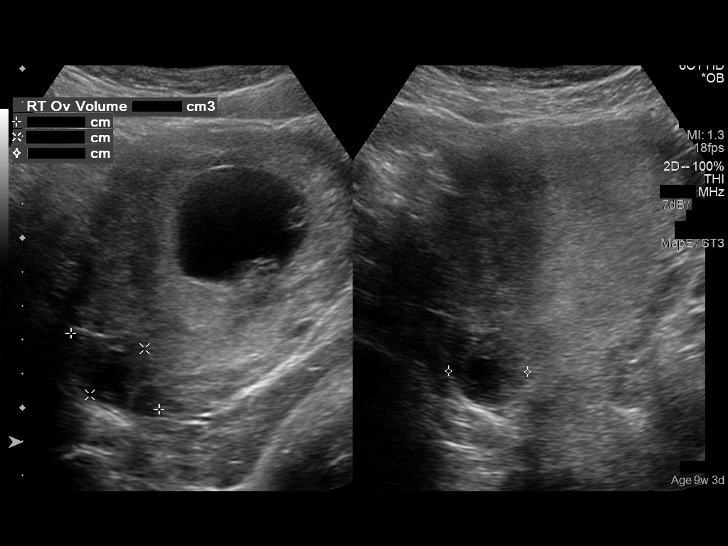
[im 28/45]
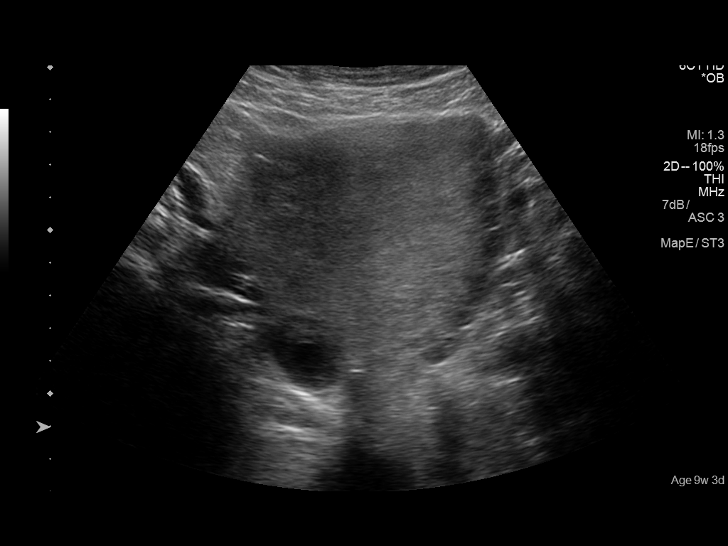
[im 31/45]
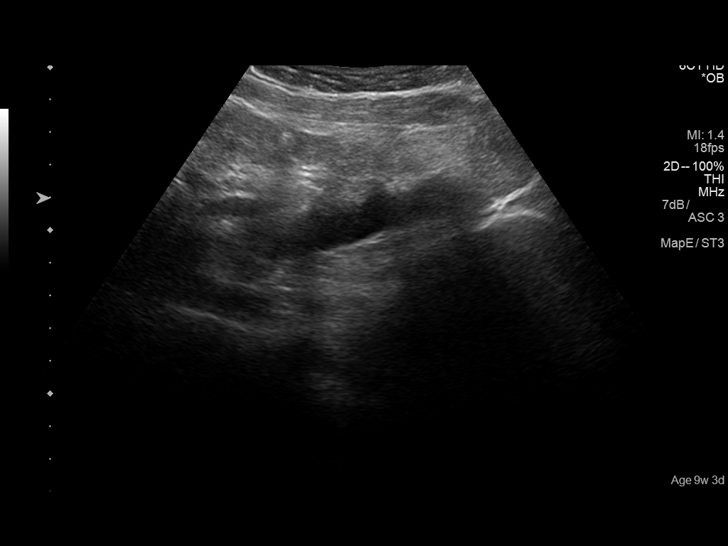
[im 35/45]
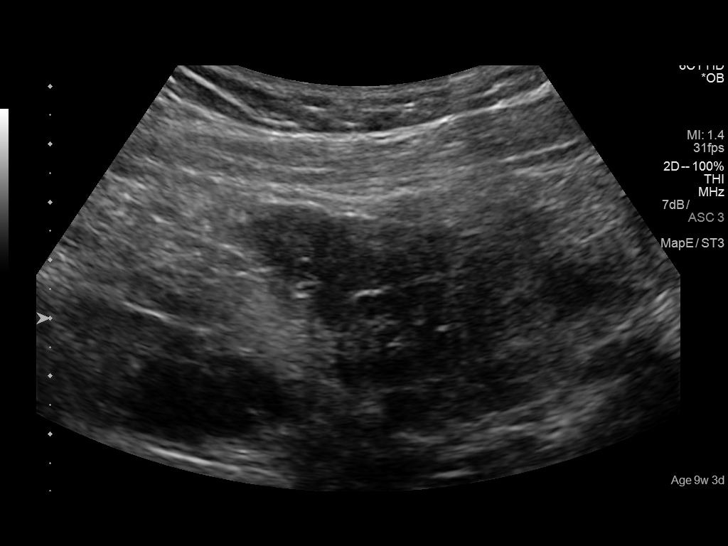
[im 38/45]
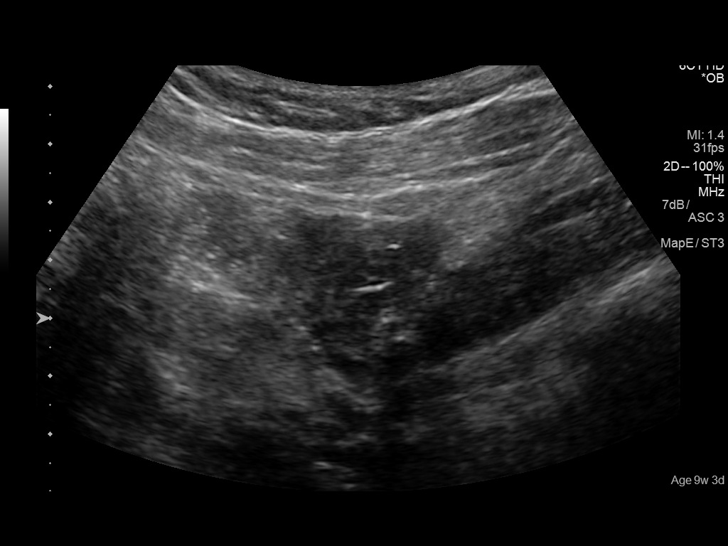
[im 41/45]
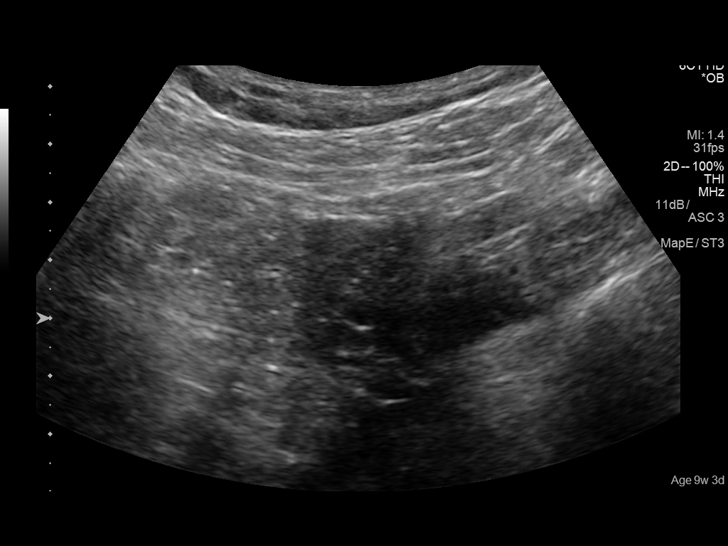
[im 45/45]
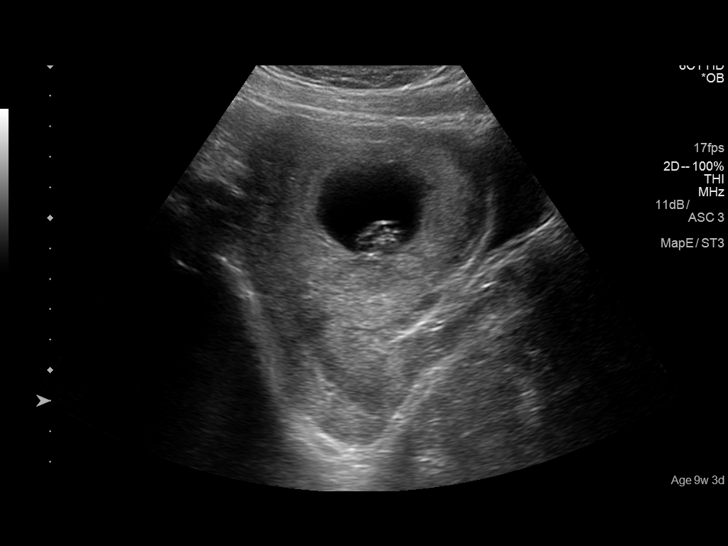

[14 of 28 positions shown; findings below may reference images not displayed]

FINDINGS: Intrauterine gestational sac: Single

Yolk sac:  Visualized.

Embryo:  Visualized.

Cardiac Activity: Visualized.

Heart Rate: 160 bpm

CRL:   26  mm   9 w 3 d                  US EDC: 03/08/2018

Subchorionic hemorrhage:  None visualized.

Maternal uterus/adnexae: Normal right and left ovaries. No
subchorionic hemorrhage. No free fluid in the pelvis.
IMPRESSION: Single live intrauterine gestation.  No subchorionic hemorrhage.

## 2018-10-04 ENCOUNTER — Ambulatory Visit (HOSPITAL_COMMUNITY)
Admission: EM | Admit: 2018-10-04 | Discharge: 2018-10-04 | Disposition: A | Discharge status: Home / Self Care | Payer: Self-pay | Attending: Family Medicine | Admitting: Family Medicine

## 2018-10-04 ENCOUNTER — Encounter (HOSPITAL_COMMUNITY): Payer: Self-pay | Admitting: Emergency Medicine

## 2018-10-04 DIAGNOSIS — L03011 Cellulitis of right finger: Secondary | ICD-10-CM

## 2018-10-04 MED ORDER — AMOXICILLIN-POT CLAVULANATE 875-125 MG PO TABS: 1.0000 | ORAL_TABLET | Freq: Two times a day (BID) | ORAL | 0 refills | Status: DC

## 2018-10-04 MED ORDER — MUPIROCIN 2 % EX OINT: 1.0000 "application " | TOPICAL_OINTMENT | Freq: Two times a day (BID) | CUTANEOUS | 0 refills | Status: DC

## 2018-10-04 NOTE — Discharge Instructions (Addendum)
Start augmentin as directed. You can use bactroban to dress finger to prevent nail biting for now. Warm compress to the area. Monitor for spreading redness, warmth, fever, swelling with discoloration, follow up for reevaluation needed.

## 2018-10-04 NOTE — ED Triage Notes (Signed)
Pt here for infection around cuticle of right index finger with swelling

## 2018-10-04 NOTE — ED Provider Notes (Signed)
[facility]    CSN: [id number] Arrival date & time: 10/04/18  1023     History   Chief Complaint Chief Complaint  Patient presents with  . Hand Pain    HPI Kelly [name] is a 17 y.o. female.   17 year old female comes in for few day history of right index finger pain and swelling.  Patient bites her nails, and started noticing redness and swelling along the cuticle.  She denies spreading redness, increased warmth, drainage.  States usually there is a small abscess for her to drink, but has not been able to squeeze anything out.  She has mild pain to the area.  Denies fever, chills, night sweats.  Has not tried anything for the symptoms.  Not breast-feeding.     Past Medical History:  Diagnosis Date  . 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 Cannabis abuse 11/12/2015  . Chlamydia   . 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 GERD (gastroesophageal reflux disease)   . Gonorrhea   . Seizures [facility])    one seizure August 2016  . Vomiting     Patient Active Problem List   Diagnosis Date Noted  . Preterm premature rupture of membranes (PPROM) with unknown onset of labor 02/03/2018  . Supervision of other normal pregnancy, antepartum 09/29/2017  . Group B streptococcal bacteriuria 08/05/2017  . Chlamydia infection affecting pregnancy 07/13/2016  . Mood disorder (HCC)   . 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 Cannabis abuse 11/12/2015  . 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 ODD (oppositional defiant disorder) 11/11/2015    Past Surgical History:  Procedure Laterality Date  . tubes in ears      OB History    Gravida  2   Para  1   Term  1   Preterm  0   AB  0   Living  1     SAB  0   TAB  0   Ectopic  0   Multiple  0   Live Births  1            Home Medications    Prior to Admission medications     Medication Sig Start Date End Date Taking? Authorizing Provider  amoxicillin-clavulanate (AUGMENTIN) 875-125 MG tablet Take 1 tablet by mouth every 12 (twelve) hours. 10/04/18   Cathie Hoops, Valentine Kuechle V, PA-C  calcium carbonate (TUMS - DOSED IN MG ELEMENTAL CALCIUM) 500 MG chewable tablet Chew 1-2 tablets by mouth daily.    [provider]  doxycycline (VIBRAMYCIN) 100 MG capsule Take 1 capsule (100 mg total) by mouth 2 (two) times daily. Patient not taking: Reported on 07/25/2018 02/23/18   Azalia Bilis, MD  ibuprofen (ADVIL,MOTRIN) 600 MG tablet Take 1 tablet (600 mg total) by mouth every 6 (six) hours. Patient not taking: Reported on 07/25/2018 02/05/18   Ellwood Dense, DO  mupirocin ointment (BACTROBAN) 2 % Apply 1 application topically 2 (two) times daily. 10/04/18   Cathie Hoops, Tameya Kuznia V, PA-C  Prenatal Vit-Fe Fumarate-FA (PREPLUS) 27-1 MG TABS Take 1 tablet by mouth daily. Patient not taking: Reported on 07/25/2018 08/26/17   Judeth Horn, NP    Family History Family History  Problem Relation Age of Onset  . Cancer Maternal Uncle     Social History Social History   Tobacco Use  . Smoking status: Former Smoker    Types: Cigarettes    Last attempt to quit:  05/25/2016    Years since quitting: 2.3  . Smokeless tobacco: Never Used  Substance Use Topics  . Alcohol use: No  . Drug use: No    Types: Marijuana, Benzodiazepines    Comment: "I haven't smoked in a week"     Allergies   Patient has no known allergies.   Review of Systems Review of Systems  Reason unable to perform ROS: See HPI as above.     Physical Exam Triage Vital Signs ED Triage Vitals [10/04/18 1052]  Enc Vitals Group     BP 122/73     Pulse Rate 84     Resp 18     Temp 98.3 F (36.8 C)     Temp Source Oral     SpO2 99 %     Weight      Height      Head Circumference      Peak Flow      Pain Score 4     Pain Loc      Pain Edu?      Excl. in GC?    No data found.  Updated Vital Signs BP 122/73 (BP  Location: Left Arm)   Pulse 84   Temp 98.3 F (36.8 C) (Oral)   Resp 18   SpO2 99%   Physical Exam  Constitutional: She is oriented to person, place, and time. She appears well-developed and well-nourished. No distress.  HENT:  Head: Normocephalic and atraumatic.  Eyes: Pupils are equal, round, and reactive to light. Conjunctivae are normal.  Musculoskeletal:  Swelling along the cuticle with erythema and warmth.  No discoloration, paronychia seen.  Full range of motion of finger.  Sensation intact, cap refill less than 2 seconds.  Neurological: She is alert and oriented to person, place, and time.  Skin: She is not diaphoretic.     UC Treatments / Results  Labs (all labs ordered are listed, but only abnormal results are displayed) Labs Reviewed - No data to display  EKG None  Radiology No results found.  Procedures Procedures (including critical care time)  Medications Ordered in UC Medications - No data to display  Initial Impression / Assessment and Plan / UC Course  I have reviewed the triage vital signs and the nursing notes.  Pertinent labs & imaging results that were available during my care of the patient were reviewed by me and considered in my medical decision making (see chart for details).    Augmentin as directed.  Wound care instructions provided.  Warm compress.  Return precautions given.  Final Clinical Impressions(s) / UC Diagnoses   Final diagnoses:  Cellulitis of finger of right hand    ED Prescriptions    Medication Sig Dispense Auth. Provider   amoxicillin-clavulanate (AUGMENTIN) 875-125 MG tablet Take 1 tablet by mouth every 12 (twelve) hours. 14 tablet Chukwudi Ewen V, PA-C   mupirocin ointment (BACTROBAN) 2 % Apply 1 application topically 2 (two) times daily. 22 g Threasa Alpha, [place] 10/04/18 1138

## 2018-11-03 ENCOUNTER — Emergency Department (HOSPITAL_COMMUNITY)
Admission: EM | Admit: 2018-11-03 | Discharge: 2018-11-03 | Disposition: A | Discharge status: Home / Self Care | Payer: Medicaid Other | Attending: Emergency Medicine | Admitting: Emergency Medicine

## 2018-11-03 ENCOUNTER — Other Ambulatory Visit: Payer: Self-pay

## 2018-11-03 ENCOUNTER — Encounter (HOSPITAL_COMMUNITY): Payer: Self-pay

## 2018-11-03 DIAGNOSIS — F17210 Nicotine dependence, cigarettes, uncomplicated: Secondary | ICD-10-CM | POA: Insufficient documentation

## 2018-11-03 DIAGNOSIS — A599 Trichomoniasis, unspecified: Secondary | ICD-10-CM

## 2018-11-03 DIAGNOSIS — Z202 Contact with and (suspected) exposure to infections with a predominantly sexual mode of transmission: Secondary | ICD-10-CM

## 2018-11-03 LAB — POC URINE PREG, ED: Preg Test, Ur: NEGATIVE

## 2018-11-03 LAB — URINALYSIS, COMPLETE (UACMP) WITH MICROSCOPIC
Bilirubin Urine: NEGATIVE
GLUCOSE, UA: NEGATIVE mg/dL
Ketones, ur: NEGATIVE mg/dL
NITRITE: NEGATIVE
Protein, ur: NEGATIVE mg/dL
RBC / HPF: >50 RBC/hpf — ABNORMAL HIGH (ref 0–5)
SPECIFIC GRAVITY, URINE: 1.004 — AB (ref 1.005–1.030)
WBC, UA: >50 WBC/hpf — ABNORMAL HIGH (ref 0–5)
pH: 6 (ref 5.0–8.0)

## 2018-11-03 LAB — WET PREP, GENITAL
Clue Cells Wet Prep HPF POC: NONE SEEN
Sperm: NONE SEEN
YEAST WET PREP: NONE SEEN

## 2018-11-03 MED ORDER — ONDANSETRON 8 MG PO TBDP
8.0000 mg | ORAL_TABLET | Freq: Once | ORAL | Status: AC
  Administered 2018-11-03: 8 mg via ORAL
  Filled 2018-11-03: qty 1

## 2018-11-03 MED ORDER — CEFTRIAXONE SODIUM 250 MG IJ SOLR
250.0000 mg | Freq: Once | INTRAMUSCULAR | Status: AC
  Administered 2018-11-03: 250 mg via INTRAMUSCULAR
  Filled 2018-11-03: qty 250

## 2018-11-03 MED ORDER — METRONIDAZOLE 500 MG PO TABS
2000.0000 mg | ORAL_TABLET | Freq: Once | ORAL | Status: AC
  Administered 2018-11-03: 2000 mg via ORAL
  Filled 2018-11-03: qty 4

## 2018-11-03 MED ORDER — NITROFURANTOIN MONOHYD MACRO 100 MG PO CAPS: 100.0000 mg | ORAL_CAPSULE | Freq: Two times a day (BID) | ORAL | 0 refills | Status: AC

## 2018-11-03 MED ORDER — AZITHROMYCIN 250 MG PO TABS
1000.0000 mg | ORAL_TABLET | Freq: Once | ORAL | Status: AC
  Administered 2018-11-03: 1000 mg via ORAL
  Filled 2018-11-03: qty 4

## 2018-11-03 MED ORDER — STERILE WATER FOR INJECTION IJ SOLN
INTRAMUSCULAR | Status: AC
  Administered 2018-11-03: 10 mL
  Filled 2018-11-03: qty 10

## 2018-11-03 NOTE — ED Provider Notes (Signed)
[facility]-EMERGENCY DEPT Provider Note   CSN: [id number] Arrival date & time: 11/03/18  1047   History   Chief Complaint Chief Complaint  Patient presents with  . Vaginal Discharge    HPI [name] is a 17 y.o. female with past medical history significant for substance abuse, gonorrhea, chlamydia who presents for evaluation of vaginal discharge. Patient states this discharge is similar to her previous Chlamydia infections.  She states vaginal discharge is yellow in color and has been present x3 days.  States she recently was separated from her boyfriend and she believes "he cheated on me." Denies fever, chills, nausea, vomiting, abdominal pain, pelvic pain, dysuria, diarrhea, rashes or lesions, CVA pain.  Denies aggravating or alleviating factors unless otherwise stated above.  Admits to history of previous STDs.  States she does not use protection for sexual intercourse.  Patient unknown of last menstrual cycle.  History obtained from patient.  No interpreter was used.  HPI  Past Medical History:  Diagnosis Date  . 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 Cannabis abuse 11/12/2015  . Chlamydia   . 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 GERD (gastroesophageal reflux disease)   . Gonorrhea   . Seizures [facility](HCC)    one seizure August 2016  . Vomiting     Patient Active Problem List   Diagnosis Date Noted  . Preterm premature rupture of membranes (PPROM) with unknown onset of labor 02/03/2018  . Supervision of other normal pregnancy, antepartum 09/29/2017  . Group B streptococcal bacteriuria 08/05/2017  . Chlamydia infection affecting pregnancy 07/13/2016  . Mood disorder (HCC)   . 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 Cannabis abuse 11/12/2015  . 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 ODD (oppositional defiant disorder)  11/11/2015    Past Surgical History:  Procedure Laterality Date  . tubes in ears       OB History    Gravida  2   Para  1   Term  1   Preterm  0   AB  0   Living  1     SAB  0   TAB  0   Ectopic  0   Multiple  0   Live Births  1            Home Medications    Prior to Admission medications   Medication Sig Start Date End Date Taking? Authorizing Provider  nitrofurantoin, macrocrystal-monohydrate, (MACROBID) 100 MG capsule Take 1 capsule (100 mg total) by mouth 2 (two) times daily for 7 days. 11/03/18 11/10/18  ,  A, PA-C    Family History Family History  Problem Relation Age of Onset  . Cancer Maternal Uncle     Social History Social History   Tobacco Use  . Smoking status: Current Every Day Smoker    Packs/day: 0.50    Types: Cigarettes    Last attempt to quit: 05/25/2016    Years since quitting: 2.4  . Smokeless tobacco: Never Used  Substance Use Topics  . Alcohol use: No  . Drug use: No    Types: Marijuana, Benzodiazepines    Comment: "I haven't smoked in a week"     Allergies   Patient has no known allergies.   Review of Systems Review of Systems  Constitutional: Negative.   Respiratory: Negative for cough and shortness of breath.   Cardiovascular: Negative for chest pain.  Gastrointestinal: Negative.   Genitourinary: Positive for vaginal discharge. Negative for decreased urine volume, difficulty urinating, dysuria, enuresis, flank pain, frequency, genital sores, hematuria, menstrual problem, pelvic pain, urgency, vaginal bleeding and vaginal pain.  Musculoskeletal: Negative.   Skin: Negative.  Negative for rash.  Neurological: Negative.   All other systems reviewed and are negative.    Physical Exam Updated Vital Signs BP 122/74 (BP Location: Right Arm)   Pulse 81   Temp (!) 97.4 F (36.3 C) (Oral)   Resp 16   Ht 5\' 6"  (1.676 m)   Wt 62.4 kg   LMP 10/04/2018   SpO2 100%   BMI 22.21 kg/m   Physical  Exam  Constitutional: Vital signs are normal. She appears well-developed and well-nourished.  Non-toxic appearance. She does not have a sickly appearance. She does not appear ill. No distress.  HENT:  Head: Atraumatic.  Mouth/Throat: Oropharynx is clear and moist.  Eyes: Pupils are equal, round, and reactive to light.  Neck: Normal range of motion.  Cardiovascular: Normal rate, regular rhythm, normal heart sounds, intact distal pulses and normal pulses. Exam reveals no gallop and no friction rub.  No murmur heard. Pulmonary/Chest: Effort normal and breath sounds normal. No accessory muscle usage or stridor. No tachypnea. No respiratory distress. She has no decreased breath sounds. She has no wheezes. She has no rhonchi. She has no rales.  Abdominal: Soft. Normal appearance and bowel sounds are normal. She exhibits no distension, no abdominal bruit and no mass. There is no tenderness. There is no rigidity, no rebound and no guarding. No hernia.  Genitourinary:  Genitourinary Comments: Normal appearing external female genitalia without rashes or lesions, normal vaginal epithelium. Normal appearing cervix  petechiae. Moderate yellow discharge in vaginal vault. Cervical os is closed. There is no bleeding noted at the os. Mild Odor. Bimanual: No CMT, nontender.  No palpable adnexal masses or tenderness. Uterus midline and not fixed. Rectovaginal exam was deferred.  No cystocele or rectocele noted. No pelvic lymphadenopathy noted. Wet prep was obtained.  Cultures for gonorrhea and chlamydia collected. Exam performed with chaperone in room.  Musculoskeletal: Normal range of motion.  Neurological: She is alert.  Skin: Skin is warm and dry. She is not diaphoretic.  Psychiatric: She has a normal mood and affect.  Nursing note and vitals reviewed.    ED Treatments / Results  Labs (all labs ordered are listed, but only abnormal results are displayed) Labs Reviewed  WET PREP, GENITAL - Abnormal; Notable  for the following components:      Result Value   Trich, Wet Prep PRESENT (*)    WBC, Wet Prep HPF POC MANY (*)    All other components within normal limits  URINALYSIS, COMPLETE (UACMP) WITH MICROSCOPIC - Abnormal; Notable for the following components:   APPearance CLOUDY (*)    Specific Gravity, Urine 1.004 (*)    Hgb urine dipstick MODERATE (*)    Leukocytes, UA LARGE (*)    RBC / HPF >50 (*)    WBC, UA >50 (*)    Bacteria, UA MANY (*)    All other components within normal limits  HIV ANTIBODY (ROUTINE TESTING W REFLEX)  RPR  POC URINE PREG, ED  GC/CHLAMYDIA PROBE AMP ([place]) NOT AT [facility]    EKG None  Radiology No results found.  Procedures Procedures (including critical care time)  Medications Ordered in ED Medications  azithromycin (ZITHROMAX) tablet 1,000 mg (1,000 mg Oral Given 11/03/18 1418)  cefTRIAXone (ROCEPHIN) injection 250  mg (250 mg Intramuscular Given 11/03/18 1420)  metroNIDAZOLE (FLAGYL) tablet 2,000 mg (2,000 mg Oral Given 11/03/18 1418)  sterile water (preservative free) injection (10 mLs  Given 11/03/18 1420)  ondansetron (ZOFRAN-ODT) disintegrating tablet 8 mg (8 mg Oral Given 11/03/18 1432)     Initial Impression / Assessment and Plan / ED Course  I have reviewed the triage vital signs and the nursing notes.  Pertinent labs & imaging results that were available during my care of the patient were reviewed by me and considered in my medical decision making (see chart for details).  17 year old who appears otherwise well presents for evaluation of vaginal discharge. Hx previous gonorrhea and chlamydia infections. States vaginal discharge is similar to previous chlamydia infection.  Discharge been present x3 days and yellow in color.  Denies pelvic pain, abdominal pain, nausea, vomiting.  Abdomen nontender without rebound, rigidity or guarding.  Afebrile, nonseptic, non-ill-appearing.  Will obtain STD screening, urinalysis, hCG and reevaluate.  GU exam  with moderate yellow discharge in vaginal vault.  No cervical motion tenderness, low suspicion for PID.  Wet prep consistent with trichomonas, urinalysis consistent with UTI, hCG negative. Patient treated with prophylactic azithromycin and Rocephin for gonorrhea/chlamydia. Patient understands that HIV, RPR, gonorrhea and chlamydia testing are pending.  Patient understands that these are positive she will need to notify her sexual partners that she has tested positive for these and they need to be treated. Will dc home on Macrobid for UTI. Patient given Flagyl in ED for Trichomonas.   Patient is nontoxic, nonseptic appearing, in no apparent distress. Patient does not meet the SIRS or Sepsis criteria.  On repeat exam patient does not have a surgical abdomin and there are no peritoneal signs.  No indication of appendicitis, bowel obstruction, bowel perforation, cholecystitis, diverticulitis, PID or ectopic pregnancy. Pt has been advised to not drink alcohol while on this medication.  Patient to be discharged with instructions to follow up with OBGYN/PCP. Discussed importance of using protection when sexually active.  Patient is hemodynamically stable and appropriate for DC home at this time.  Discussed return precautions.  Patient voiced understanding and is agreeable for follow-up.   Final Clinical Impressions(s) / ED Diagnoses   Final diagnoses:  Trichomonas infection  STD exposure    ED Discharge Orders         Ordered    nitrofurantoin, macrocrystal-monohydrate, (MACROBID) 100 MG capsule  2 times daily     11/03/18 1431           ,  A, PA-C 11/03/18 1438    Terrilee Files, MD 11/03/18 1743

## 2018-11-03 NOTE — ED Notes (Signed)
Following Rocephin shot, pt reports she feels nauseas

## 2018-11-03 NOTE — Discharge Instructions (Addendum)
You were evaluated today for vaginal discharge.  You do have trichomonas which is an STD.  We have treated you with prophylactic azithromycin and Rocephin for gonorrhea/chlamydia.  Your gonorrhea and Chlamydia testing as well as your HIV and syphilis testing is pending at DC.  You will be called and notified of these results are positive.  Please inform her sexual partners that they will need to be treated as well.  Follow-up with your PCP for reevaluation.  Return to the ED for any worsening symptoms.

## 2018-11-03 NOTE — ED Triage Notes (Signed)
Pt states that she recently got back together with her baby dady. Pt states that since then, she has had yellow discharge with a "smell". Pt states it does not seem to be the same as the last time she had chlamydia.

## 2018-11-04 LAB — HIV ANTIBODY (ROUTINE TESTING W REFLEX): HIV Screen 4th Generation wRfx: NONREACTIVE

## 2018-11-04 LAB — RPR: RPR Ser Ql: NONREACTIVE

## 2018-11-04 LAB — GC/CHLAMYDIA PROBE AMP (~~LOC~~) NOT AT ARMC
Chlamydia: NEGATIVE
Neisseria Gonorrhea: POSITIVE — AB

## 2018-11-16 ENCOUNTER — Encounter (HOSPITAL_COMMUNITY): Payer: Self-pay

## 2018-11-16 ENCOUNTER — Emergency Department (HOSPITAL_COMMUNITY)
Admission: EM | Admit: 2018-11-16 | Discharge: 2018-11-16 | Disposition: A | Discharge status: Home / Self Care | Payer: Self-pay | Attending: Emergency Medicine | Admitting: Emergency Medicine

## 2018-11-16 ENCOUNTER — Emergency Department (HOSPITAL_COMMUNITY): Payer: Self-pay

## 2018-11-16 ENCOUNTER — Other Ambulatory Visit: Payer: Self-pay

## 2018-11-16 DIAGNOSIS — Y999 Unspecified external cause status: Secondary | ICD-10-CM | POA: Insufficient documentation

## 2018-11-16 DIAGNOSIS — S0011XA Contusion of right eyelid and periocular area, initial encounter: Secondary | ICD-10-CM | POA: Insufficient documentation

## 2018-11-16 DIAGNOSIS — H1131 Conjunctival hemorrhage, right eye: Secondary | ICD-10-CM | POA: Insufficient documentation

## 2018-11-16 DIAGNOSIS — E041 Nontoxic single thyroid nodule: Secondary | ICD-10-CM

## 2018-11-16 DIAGNOSIS — T07XXXA Unspecified multiple injuries, initial encounter: Secondary | ICD-10-CM

## 2018-11-16 DIAGNOSIS — R51 Headache: Secondary | ICD-10-CM | POA: Insufficient documentation

## 2018-11-16 DIAGNOSIS — S1083XA Contusion of other specified part of neck, initial encounter: Secondary | ICD-10-CM | POA: Insufficient documentation

## 2018-11-16 DIAGNOSIS — Y929 Unspecified place or not applicable: Secondary | ICD-10-CM | POA: Insufficient documentation

## 2018-11-16 DIAGNOSIS — S0083XA Contusion of other part of head, initial encounter: Secondary | ICD-10-CM | POA: Insufficient documentation

## 2018-11-16 DIAGNOSIS — Y939 Activity, unspecified: Secondary | ICD-10-CM | POA: Insufficient documentation

## 2018-11-16 LAB — CBC WITH DIFFERENTIAL/PLATELET
Abs Immature Granulocytes: 0.06 10*3/uL (ref 0.00–0.07)
Basophils Absolute: 0.1 10*3/uL (ref 0.0–0.1)
Basophils Relative: 0 %
Eosinophils Absolute: 0.1 10*3/uL (ref 0.0–0.5)
Eosinophils Relative: 1 %
HCT: 41.9 % (ref 36.0–46.0)
Hemoglobin: 14.4 g/dL (ref 12.0–15.0)
Immature Granulocytes: 1 %
LYMPHS PCT: 11 %
Lymphs Abs: 1.3 10*3/uL (ref 0.7–4.0)
MCH: 31.2 pg (ref 26.0–34.0)
MCHC: 34.4 g/dL (ref 30.0–36.0)
MCV: 90.7 fL (ref 80.0–100.0)
Monocytes Absolute: 1 10*3/uL (ref 0.1–1.0)
Monocytes Relative: 8 %
NEUTROS ABS: 9.6 10*3/uL — AB (ref 1.7–7.7)
Neutrophils Relative %: 79 %
Platelets: 326 10*3/uL (ref 150–400)
RBC: 4.62 MIL/uL (ref 3.87–5.11)
RDW: 12.6 % (ref 11.5–15.5)
WBC: 12.1 10*3/uL — AB (ref 4.0–10.5)
nRBC: 0 % (ref 0.0–0.2)

## 2018-11-16 LAB — BASIC METABOLIC PANEL
Anion gap: 9 (ref 5–15)
BUN: 9 mg/dL (ref 6–20)
CO2: 27 mmol/L (ref 22–32)
Calcium: 9.4 mg/dL (ref 8.9–10.3)
Chloride: 101 mmol/L (ref 98–111)
Creatinine, Ser: 0.65 mg/dL (ref 0.44–1.00)
GFR calc Af Amer: >60 mL/min (ref >60)
GFR calc non Af Amer: >60 mL/min (ref >60)
Glucose, Bld: 94 mg/dL (ref 70–99)
Potassium: 3.2 mmol/L — ABNORMAL LOW (ref 3.5–5.1)
Sodium: 137 mmol/L (ref 135–145)

## 2018-11-16 LAB — POC URINE PREG, ED: Preg Test, Ur: NEGATIVE

## 2018-11-16 MED ORDER — HYDROCODONE-ACETAMINOPHEN 5-325 MG PO TABS
1.0000 | ORAL_TABLET | Freq: Once | ORAL | Status: AC
  Administered 2018-11-16: 1 via ORAL
  Filled 2018-11-16: qty 1

## 2018-11-16 MED ORDER — SODIUM CHLORIDE 0.9 % IV BOLUS
500.0000 mL | Freq: Once | INTRAVENOUS | Status: AC
  Administered 2018-11-16: 500 mL via INTRAVENOUS

## 2018-11-16 MED ORDER — SODIUM CHLORIDE (PF) 0.9 % IJ SOLN
INTRAMUSCULAR | Status: AC
  Filled 2018-11-16: qty 50

## 2018-11-16 MED ORDER — IOPAMIDOL (ISOVUE-370) INJECTION 76%
100.0000 mL | Freq: Once | INTRAVENOUS | Status: AC | PRN
  Administered 2018-11-16: 100 mL via INTRAVENOUS

## 2018-11-16 MED ORDER — IOPAMIDOL (ISOVUE-370) INJECTION 76%
INTRAVENOUS | Status: AC
  Filled 2018-11-16: qty 100
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
## 2018-11-16 NOTE — ED Notes (Signed)
Patient transported to CT 
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
## 2018-11-25 ENCOUNTER — Encounter (HOSPITAL_COMMUNITY): Payer: Self-pay | Admitting: Obstetrics and Gynecology

## 2018-11-25 ENCOUNTER — Other Ambulatory Visit: Payer: Self-pay

## 2018-11-25 ENCOUNTER — Emergency Department (HOSPITAL_COMMUNITY)
Admission: EM | Admit: 2018-11-25 | Discharge: 2018-11-25 | Disposition: A | Discharge status: Home / Self Care | Payer: Medicaid Other | Attending: Emergency Medicine | Admitting: Emergency Medicine

## 2018-11-25 DIAGNOSIS — N760 Acute vaginitis: Secondary | ICD-10-CM | POA: Insufficient documentation

## 2018-11-25 DIAGNOSIS — F17210 Nicotine dependence, cigarettes, uncomplicated: Secondary | ICD-10-CM | POA: Insufficient documentation

## 2018-11-25 DIAGNOSIS — L739 Follicular disorder, unspecified: Secondary | ICD-10-CM

## 2018-11-25 DIAGNOSIS — Z113 Encounter for screening for infections with a predominantly sexual mode of transmission: Secondary | ICD-10-CM | POA: Insufficient documentation

## 2018-11-25 DIAGNOSIS — K644 Residual hemorrhoidal skin tags: Secondary | ICD-10-CM | POA: Insufficient documentation

## 2018-11-25 DIAGNOSIS — B9689 Other specified bacterial agents as the cause of diseases classified elsewhere: Secondary | ICD-10-CM | POA: Insufficient documentation

## 2018-11-25 LAB — WET PREP, GENITAL
SPERM: NONE SEEN
TRICH WET PREP: NONE SEEN
Yeast Wet Prep HPF POC: NONE SEEN

## 2018-11-25 LAB — URINALYSIS, ROUTINE W REFLEX MICROSCOPIC
Bilirubin Urine: NEGATIVE
Glucose, UA: NEGATIVE mg/dL
Ketones, ur: NEGATIVE mg/dL
NITRITE: NEGATIVE
Protein, ur: 30 mg/dL — AB
Specific Gravity, Urine: 1.026 (ref 1.005–1.030)
pH: 7 (ref 5.0–8.0)

## 2018-11-25 LAB — POC URINE PREG, ED: Preg Test, Ur: NEGATIVE

## 2018-11-25 MED ORDER — METRONIDAZOLE 500 MG PO TABS: 500.0000 mg | ORAL_TABLET | Freq: Two times a day (BID) | ORAL | 0 refills | Status: AC

## 2018-11-25 MED ORDER — HYDROCORTISONE 2.5 % RE CREA: TOPICAL_CREAM | RECTAL | 0 refills | Status: DC

## 2018-11-25 MED ORDER — CEPHALEXIN 500 MG PO CAPS: 500.0000 mg | ORAL_CAPSULE | Freq: Two times a day (BID) | ORAL | 0 refills | Status: AC

## 2018-11-25 NOTE — ED Provider Notes (Signed)
[facility]-EMERGENCY DEPT Provider Note   CSN: [id number] Arrival date & time: 11/25/18  1444   History   Chief Complaint Chief Complaint  Patient presents with  . Vaginal Itching  . Hemorrhoids    HPI Kelly [name] is a 17 y.o. female with past medical history significant for gonorrhea, chlamydia, benzodiazepine use, conduct disorder who presents for evaluation of vaginal itching and hemorrhoid pain.  Patient states she has noted a sore to her left labia.  States this is extremely itchy. Presents x2 weeks. States this is also painful.  Patient states "my sister looked at it and told me it was herpes."  Denies previous history of herpes.  Patient also requesting "tested for all STDs."  Patient states she has a history of hemorrhoids and these have recently been bothering her.  States they are painful and itching.  Patient states she has noted blood when she wipes.  Patient states she is also supposed to start her menstrual cycle and she is unsure if the blood is coming from her vagina or rectum. Rates her pain a 4/10. Denies radiation. Has not taken anything for her symptoms. Denies additional aggrivating or alleviating factors. Denies fever, chills, nausea, vomiting, chest pain, SOB, abdominal pain, pelvic pain, vaginal discharge, diarrhea, constipation, dysuria. Patient states that she has had a hemorrhoid previously. States she has not seen blood in the toilet or blood in her stool. Denies melena.  History obtained from patient. No interpretor was used. HPI  Past Medical History:  Diagnosis Date  . 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 Cannabis abuse 11/12/2015  . Chlamydia   . 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 GERD (gastroesophageal reflux disease)   . Gonorrhea   . Seizures [facility](HCC)    one seizure August 2016  . Vomiting     Patient Active Problem List   Diagnosis Date Noted  .  Preterm premature rupture of membranes (PPROM) with unknown onset of labor 02/03/2018  . Supervision of other normal pregnancy, antepartum 09/29/2017  . Group B streptococcal bacteriuria 08/05/2017  . Chlamydia infection affecting pregnancy 07/13/2016  . Mood disorder (HCC)   . 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 Cannabis abuse 11/12/2015  . 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 ODD (oppositional defiant disorder) 11/11/2015    Past Surgical History:  Procedure Laterality Date  . tubes in ears       OB History    Gravida  2   Para  1   Term  1   Preterm  0   AB  0   Living  2     SAB  0   TAB  0   Ectopic  0   Multiple  0   Live Births  1            Home Medications    Prior to Admission medications   Medication Sig Start Date End Date Taking? Authorizing Provider  cephALEXin (KEFLEX) 500 MG capsule Take 1 capsule (500 mg total) by mouth 2 (two) times daily for 5 days. 11/25/18 11/30/18  Kimyetta Flott A, PA-C  hydrocortisone (ANUSOL-HC) 2.5 % rectal cream Apply rectally 2 times daily 11/25/18   Silvana Holecek A, PA-C  metroNIDAZOLE (FLAGYL) 500 MG tablet Take 1 tablet (500 mg total) by mouth 2 (two) times daily for 7 days. 11/25/18 12/02/18  Bryssa Tones A, PA-C    Family History  Family History  Problem Relation Age of Onset  . Cancer Maternal Uncle     Social History Social History   Tobacco Use  . Smoking status: Current Every Day Smoker    Packs/day: 0.50    Types: Cigarettes    Last attempt to quit: 05/25/2016    Years since quitting: 2.5  . Smokeless tobacco: Never Used  Substance Use Topics  . Alcohol use: No  . Drug use: No    Types: Marijuana, Benzodiazepines    Comment: "I haven't smoked in a week"     Allergies   Patient has no known allergies.   Review of Systems Review of Systems  Constitutional: Negative.   HENT: Negative.   Respiratory: Negative.     Cardiovascular: Negative.   Gastrointestinal: Negative.   Genitourinary: Positive for genital sores. Negative for decreased urine volume, difficulty urinating, dysuria, flank pain, hematuria, menstrual problem, pelvic pain, urgency, vaginal discharge and vaginal pain.       Gu bleeding, unsure of source.  Musculoskeletal: Negative.   Skin: Negative.   Neurological: Negative.   All other systems reviewed and are negative.    Physical Exam Updated Vital Signs BP (!) 142/92 (BP Location: Right Arm)   Pulse 87   Temp 97.9 F (36.6 C) (Oral)   Resp 16   LMP 11/23/2018   SpO2 100%   Physical Exam Vitals signs and nursing note reviewed.  Constitutional:      General: She is not in acute distress.    Appearance: She is well-developed. She is not ill-appearing, toxic-appearing or diaphoretic.  HENT:     Head: Normocephalic and atraumatic.     Nose: Nose normal.     Mouth/Throat:     Mouth: Mucous membranes are moist.  Eyes:     Pupils: Pupils are equal, round, and reactive to light.  Neck:     Musculoskeletal: Normal range of motion.  Cardiovascular:     Rate and Rhythm: Normal rate.     Pulses: Normal pulses.     Heart sounds: Normal heart sounds. No murmur. No friction rub. No gallop.   Pulmonary:     Effort: Pulmonary effort is normal. No respiratory distress.     Breath sounds: Normal breath sounds. No stridor. No wheezing, rhonchi or rales.  Abdominal:     General: Bowel sounds are normal. There is no distension.     Palpations: Abdomen is soft. There is no mass.     Tenderness: There is no abdominal tenderness. There is no right CVA tenderness, left CVA tenderness, guarding or rebound.     Hernia: No hernia is present.  Genitourinary:    Vagina: No signs of injury and foreign body. Lesions present. No vaginal discharge, erythema, tenderness or bleeding.     Cervix: Cervical bleeding present. No cervical motion tenderness, discharge, friability, lesion or erythema.      Uterus: Normal. Not enlarged and not tender.      Adnexa: Right adnexa normal and left adnexa normal.     Rectum: Tenderness and external hemorrhoid present.     Comments: Normal appearing external female genitalia without rashes normal vaginal epithelium.  Folliculitis to bilateral labia.  Normal appearing cervix without discharge or petechiae. Cervical os is closed. There is  bleeding noted at the os. Moderate Odor. Bimanual: No CMT, nontender.  No palpable adnexal masses or tenderness. Uterus midline and not fixed. Rectovaginal exam was deferred. No cystocele or rectocele noted. No pelvic lymphadenopathy noted. Wet prep was obtained.  Cultures for gonorrhea and chlamydia collected. Exam performed with chaperone in room.  Rectal exam with nonthrombosed external hemorrhoid. Tender to palpation.  Patient refused internal rectal exam.  Refused Hemoccult. Musculoskeletal: Normal range of motion.     Comments: Moves all extremities without difficulty.  Able to ambulate in room without difficulty.  Lymphadenopathy:     Comments: No pelvic lymphadenopathy.  Skin:    General: Skin is warm and dry.  Neurological:     Mental Status: She is alert.      ED Treatments / Results  Labs (all labs ordered are listed, but only abnormal results are displayed) Labs Reviewed  WET PREP, GENITAL - Abnormal; Notable for the following components:      Result Value   Clue Cells Wet Prep HPF POC PRESENT (*)    WBC, Wet Prep HPF POC MANY (*)    All other components within normal limits  URINALYSIS, ROUTINE W REFLEX MICROSCOPIC - Abnormal; Notable for the following components:   Color, Urine AMBER (*)    APPearance TURBID (*)    Hgb urine dipstick LARGE (*)    Protein, ur 30 (*)    Leukocytes, UA TRACE (*)    Bacteria, UA RARE (*)    Non Squamous Epithelial 0-5 (*)    All other components within normal limits  URINE CULTURE  HIV ANTIBODY (ROUTINE TESTING W REFLEX)  RPR  POC URINE PREG, ED  POC OCCULT  BLOOD, ED  GC/CHLAMYDIA PROBE AMP ([place]) NOT AT [facility]    EKG None  Radiology No results found.  Procedures Procedures (including critical care time)  Medications Ordered in ED Medications - No data to display   Initial Impression / Assessment and Plan / ED Course  I have reviewed the triage vital signs and the nursing notes.  Pertinent labs & imaging results that were available during my care of the patient were reviewed by me and considered in my medical decision making (see chart for details).  17 year old who appears well presents for evaluation of vaginal sore, hemorrhoids and bleeding. States unsure of location of bleeding from vagina or rectum. Pain and itching to hemorrhoids.  Hx of hemorrhoids.  Requesting STD testing. Afebrile, non septic, non-ill appearing. Abdomen soft, non tender without rebound or guarding. GU exam without cervical motion tenderness. Bleedin in vaginal vault. Patient states she is "supposed to start my period."  Moderate vaginal odor without gross discharge.  No cervical friability. Patient refused internal rectal exam, unable to collect hemoccult. Patient with non thrombosed external hemorrhoid, tender to palpation. Not active bleeding.  Patient with multiple areas of folliculitis to labia.  No fluctuance or induration.  No erythema to suggest cellulitis.  Lesions do not appear to be herpes.  Will obtain labs, urine and reevaluate.  Urinalysis positive for infection will culture, Wet Prep with Clue cells, Hcg negative, HIV, RPR, GC pending.  Bleeding likely from patient's menstrual cycle.  Denies any active bleeding from hemorrhoids, patient refused Hemoccult or internal rectal exam.  Will provide Anusol cream for hemorrhoids symptoms.  Will dc home with abx for UTI which will also cover for folliculitis.  Patient is nontoxic, nonseptic appearing, in no apparent distress. Patient does not meet the SIRS or Sepsis criteria.  On repeat exam patient does not  have a surgical abdomin and there are no peritoneal signs.  No indication of appendicitis, bowel obstruction, bowel perforation, cholecystitis, diverticulitis, PID or ectopic pregnancy. Pt understands that they have GC/Chlamydia cultures pending  and that they will need to inform all sexual partners if results return positive.  Patient does not want prophylactic treatment for GC/chlamydia.  Pt not concerning for PID because hemodynamically stable and no cervical motion tenderness on pelvic exam. Pt has also been treated with Flagyl for Bacterial Vaginosis. Pt has been advised to not drink alcohol while on this medication.  Patient to be discharged with instructions to follow up with OBGYN/PCP. Discussed importance of using protection when sexually active.  On re-evalaution abdomen soft, non tender without rebound or guarding. Patient is hemodynamically stable and appropriate for dc home at this time. Discussed return precautions. Patient voiced understanding and is agreeable for follow up.    Final Clinical Impressions(s) / ED Diagnoses   Final diagnoses:  External hemorrhoid  Bacterial vaginosis  Folliculitis    ED Discharge Orders         Ordered    metroNIDAZOLE (FLAGYL) 500 MG tablet  2 times daily     11/25/18 1703    hydrocortisone (ANUSOL-HC) 2.5 % rectal cream     11/25/18 1703    cephALEXin (KEFLEX) 500 MG capsule  2 times daily     11/25/18 1710           Dashawn Golda A, PA-C 11/25/18 1746    Terrilee Files, MD 11/26/18 0030

## 2018-11-25 NOTE — Discharge Instructions (Addendum)
Evaluated today for rectal pain and vaginal sores. You do have hemorrhoids. I have prescribed you cream for this. Your test also was positive for bacterial vaginosis. Please follow up with your PCP for reevaluation. You were not treated for gonorrhea and chlamydia. I you receive a phone call you will need to seek treatment for this.

## 2018-11-25 NOTE — ED Triage Notes (Signed)
Pt reports she has red bumps on her vagina and is concerned about HPV. Pt reports she has also had hemorrhoids that area causing her a lot of pain and she has noticed a lot of blood in her stool.

## 2018-11-26 LAB — HIV ANTIBODY (ROUTINE TESTING W REFLEX): HIV Screen 4th Generation wRfx: NONREACTIVE

## 2018-11-26 LAB — URINE CULTURE: Culture: NO GROWTH

## 2018-11-26 LAB — RPR: RPR Ser Ql: NONREACTIVE

## 2018-11-28 LAB — GC/CHLAMYDIA PROBE AMP (~~LOC~~) NOT AT ARMC
CHLAMYDIA, DNA PROBE: NEGATIVE
Neisseria Gonorrhea: NEGATIVE

## 2018-12-02 ENCOUNTER — Telehealth (HOSPITAL_COMMUNITY): Payer: Self-pay

## 2019-10-05 ENCOUNTER — Encounter (HOSPITAL_COMMUNITY): Payer: Self-pay

## 2019-10-05 ENCOUNTER — Ambulatory Visit (HOSPITAL_COMMUNITY)
Admission: EM | Admit: 2019-10-05 | Discharge: 2019-10-05 | Disposition: A | Discharge status: Home / Self Care | Payer: Medicaid Other | Attending: Family Medicine | Admitting: Family Medicine

## 2019-10-05 ENCOUNTER — Other Ambulatory Visit: Payer: Self-pay

## 2019-10-05 DIAGNOSIS — Z113 Encounter for screening for infections with a predominantly sexual mode of transmission: Secondary | ICD-10-CM | POA: Insufficient documentation

## 2019-10-05 DIAGNOSIS — Z3202 Encounter for pregnancy test, result negative: Secondary | ICD-10-CM

## 2019-10-05 LAB — POCT URINALYSIS DIP (DEVICE)
Bilirubin Urine: NEGATIVE
Glucose, UA: NEGATIVE mg/dL
Ketones, ur: NEGATIVE mg/dL
Leukocytes,Ua: NEGATIVE
Nitrite: NEGATIVE
Protein, ur: NEGATIVE mg/dL
Specific Gravity, Urine: >=1.03 (ref 1.005–1.030)
Urobilinogen, UA: 0.2 mg/dL (ref 0.0–1.0)
pH: 6 (ref 5.0–8.0)

## 2019-10-05 LAB — POCT PREGNANCY, URINE: Preg Test, Ur: NEGATIVE

## 2019-10-05 NOTE — ED Provider Notes (Signed)
[facility]    CSN: [id number] Arrival date & time: 10/05/19  1446      History   Chief Complaint Chief Complaint  Patient presents with  . SEXUALLY TRANSMITTED DISEASE    HPI Kelly [name] is a 18 y.o. female no contributing past medical history presenting today for evaluation of STD screening.  Patient states that she recently found out her partner cheated on her.  She has had some occasional dysuria and would like to be screened for STDs.  Denies any known exposures.  Denies abnormal vaginal discharge.  Denies itching or irritation.  Denies fever, nausea or vomiting.  Denies abdominal pain.  Has had some mild urinary frequency.  Unsure of STD or UTI.  HPI  Past Medical History:  Diagnosis Date  . 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 Cannabis abuse 11/12/2015  . Chlamydia   . 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 GERD (gastroesophageal reflux disease)   . Gonorrhea   . Seizures [facility])    one seizure August 2016  . Vomiting     Patient Active Problem List   Diagnosis Date Noted  . Preterm premature rupture of membranes (PPROM) with unknown onset of labor 02/03/2018  . Supervision of other normal pregnancy, antepartum 09/29/2017  . Group B streptococcal bacteriuria 08/05/2017  . Chlamydia infection affecting pregnancy 07/13/2016  . Mood disorder ([place])   . 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 Cannabis abuse 11/12/2015  . 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 ODD (oppositional defiant disorder) 11/11/2015    Past Surgical History:  Procedure Laterality Date  . tubes in ears      OB History    Gravida  2   Para  1   Term  1   Preterm  0   AB  0   Living  2     SAB  0   TAB  0   Ectopic  0   Multiple  0   Live Births  1            Home Medications    Prior to Admission medications   Not on  File    Family History Family History  Problem Relation Age of Onset  . Cancer Maternal Uncle   . Healthy Mother     Social History Social History   Tobacco Use  . Smoking status: Current Every Day Smoker    Packs/day: 0.50    Types: Cigarettes    Last attempt to quit: 05/25/2016    Years since quitting: 3.3  . Smokeless tobacco: Never Used  Substance Use Topics  . Alcohol use: No  . Drug use: No    Types: Marijuana, Benzodiazepines    Comment: "I haven't smoked in a week"     Allergies   Patient has no known allergies.   Review of Systems Review of Systems  Constitutional: Negative for fever.  Respiratory: Negative for shortness of breath.   Cardiovascular: Negative for chest pain.  Gastrointestinal: Negative for abdominal pain, diarrhea, nausea and vomiting.  Genitourinary: Positive for dysuria. Negative for flank pain, genital sores, hematuria, menstrual problem, vaginal bleeding, vaginal discharge and vaginal pain.  Musculoskeletal: Negative for back pain.  Skin: Negative for rash.  Neurological: Negative for dizziness, light-headedness and headaches.     Physical Exam Triage Vital Signs ED Triage Vitals  Enc Vitals Group     BP 10/05/19 1512  113/66     Pulse Rate 10/05/19 1512 88     Resp 10/05/19 1512 16     Temp 10/05/19 1512 97.8 F (36.6 C)     Temp Source 10/05/19 1512 Tympanic     SpO2 10/05/19 1512 98 %     Weight --      Height --      Head Circumference --      Peak Flow --      Pain Score 10/05/19 1514 0     Pain Loc --      Pain Edu? --      Excl. in GC? --    No data found.  Updated Vital Signs BP 113/66 (BP Location: Left Arm)   Pulse 88   Temp 97.8 F (36.6 C) (Tympanic)   Resp 16   LMP 09/29/2019   SpO2 98%   Visual Acuity Right Eye Distance:   Left Eye Distance:   Bilateral Distance:    Right Eye Near:   Left Eye Near:    Bilateral Near:     Physical Exam Vitals signs and nursing note reviewed.  Constitutional:       General: She is not in acute distress.    Appearance: She is well-developed.  HENT:     Head: Normocephalic and atraumatic.  Eyes:     Conjunctiva/sclera: Conjunctivae normal.  Neck:     Musculoskeletal: Neck supple.  Cardiovascular:     Rate and Rhythm: Normal rate and regular rhythm.     Heart sounds: No murmur.  Pulmonary:     Effort: Pulmonary effort is normal. No respiratory distress.     Breath sounds: Normal breath sounds.  Abdominal:     Palpations: Abdomen is soft.     Tenderness: There is no abdominal tenderness.  Skin:    General: Skin is warm and dry.  Neurological:     Mental Status: She is alert.      UC Treatments / Results  Labs (all labs ordered are listed, but only abnormal results are displayed) Labs Reviewed  POCT URINALYSIS DIP (DEVICE) - Abnormal; Notable for the following components:      Result Value   Hgb urine dipstick SMALL (*)    All other components within normal limits  POC URINE PREG, ED  POCT PREGNANCY, URINE  CERVICOVAGINAL ANCILLARY ONLY    EKG   Radiology No results found.  Procedures Procedures (including critical care time)  Medications Ordered in UC Medications - No data to display  Initial Impression / Assessment and Plan / UC Course  I have reviewed the triage vital signs and the nursing notes.  Pertinent labs & imaging results that were available during my care of the patient were reviewed by me and considered in my medical decision making (see chart for details).     Negative leuks and nitrites, not suggestive of UTI at this time.  Vaginal swab obtained will send off to check for gonorrhea, chlamydia and trichomonas as well as yeast and BV as cause of dysuria.  Deferring treatment at this time.  Will call with results and provide further treatment as needed.  Push fluids.  Declined HIV and syphilis testing.  Discussed strict return precautions. Patient verbalized understanding and is agreeable with plan.   Final Clinical Impressions(s) / UC Diagnoses   Final diagnoses:  Screen for STD (sexually transmitted disease)     Discharge Instructions     Urine did not show signs of UTI; we will call  with results of swab  We are testing you for Gonorrhea, Chlamydia, Trichomonas, Yeast and Bacterial Vaginosis. We will call you if anything is positive and let you know if you require any further treatment. Please inform partners of any positive results.   Please return if symptoms not improving with treatment, development of fever, nausea, vomiting, abdominal pain.     ED Prescriptions    None     PDMP not reviewed this encounter.   Lew DawesWieters, Kemesha Mosey C, [place] JerseyPA-C 10/05/19 1616

## 2019-10-05 NOTE — ED Triage Notes (Signed)
Pt presents to UC w/ c/o some burning on urination. Pt states she is unsure if it is an STD or UTI. She states her sexual partner may have an STD and she would like to be checked.

## 2019-10-05 NOTE — Discharge Instructions (Addendum)
Urine did not show signs of UTI; we will call with results of swab  We are testing you for Gonorrhea, Chlamydia, Trichomonas, Yeast and Bacterial Vaginosis. We will call you if anything is positive and let you know if you require any further treatment. Please inform partners of any positive results.   Please return if symptoms not improving with treatment, development of fever, nausea, vomiting, abdominal pain.

## 2019-10-10 LAB — CERVICOVAGINAL ANCILLARY ONLY
Bacterial vaginitis: POSITIVE — AB
Candida vaginitis: NEGATIVE
Chlamydia: POSITIVE — AB
Neisseria Gonorrhea: NEGATIVE
Trichomonas: NEGATIVE

## 2019-10-12 ENCOUNTER — Telehealth (HOSPITAL_COMMUNITY): Payer: Self-pay | Admitting: Emergency Medicine

## 2019-10-12 MED ORDER — METRONIDAZOLE 500 MG PO TABS: 500.0000 mg | ORAL_TABLET | Freq: Two times a day (BID) | ORAL | 0 refills | Status: DC

## 2019-10-12 MED ORDER — AZITHROMYCIN 250 MG PO TABS: 1000.0000 mg | ORAL_TABLET | Freq: Once | ORAL | 0 refills | Status: AC

## 2019-10-12 NOTE — Telephone Encounter (Signed)
Chlamydia is positive.  Rx po zithromax 1g #1 dose no refills was sent to the pharmacy of record.  Pt needs education to please refrain from sexual intercourse for 7 days to give the medicine time to work, sexual partners need to be notified and tested/treated.  Condoms may reduce risk of reinfection.  Recheck or followup with PCP for further evaluation if symptoms are not improving.   [facility] notified.  Bacterial vaginosis is positive. This was not treated at the urgent care visit.  Flagyl 500 mg BID x 7 days #14 no refills sent to patients pharmacy of choice.    Patient contacted and made aware of    results, all questions answered    

## 2019-10-17 ENCOUNTER — Telehealth (HOSPITAL_COMMUNITY): Payer: Self-pay | Admitting: Emergency Medicine

## 2019-10-17 MED ORDER — METRONIDAZOLE 500 MG PO TABS: 500.0000 mg | ORAL_TABLET | Freq: Two times a day (BID) | ORAL | 0 refills | Status: AC

## 2020-03-01 ENCOUNTER — Encounter (HOSPITAL_COMMUNITY): Payer: Self-pay

## 2020-03-01 ENCOUNTER — Other Ambulatory Visit: Payer: Self-pay

## 2020-03-01 ENCOUNTER — Emergency Department (HOSPITAL_COMMUNITY)
Admission: EM | Admit: 2020-03-01 | Discharge: 2020-03-01 | Disposition: A | Discharge status: Home / Self Care | Payer: Medicaid Other | Attending: Emergency Medicine | Admitting: Emergency Medicine

## 2020-03-01 DIAGNOSIS — Z202 Contact with and (suspected) exposure to infections with a predominantly sexual mode of transmission: Secondary | ICD-10-CM

## 2020-03-01 DIAGNOSIS — F17210 Nicotine dependence, cigarettes, uncomplicated: Secondary | ICD-10-CM | POA: Insufficient documentation

## 2020-03-01 LAB — RPR: RPR Ser Ql: NONREACTIVE

## 2020-03-01 LAB — HIV ANTIBODY (ROUTINE TESTING W REFLEX): HIV Screen 4th Generation wRfx: NONREACTIVE

## 2020-03-01 LAB — I-STAT BETA HCG BLOOD, ED (MC, WL, AP ONLY): I-stat hCG, quantitative: <5 m[IU]/mL (ref <5)

## 2020-03-01 MED ORDER — CEFTRIAXONE SODIUM 1 G IJ SOLR
500.0000 mg | Freq: Once | INTRAMUSCULAR | Status: AC
  Administered 2020-03-01: 500 mg via INTRAMUSCULAR
  Filled 2020-03-01: qty 10

## 2020-03-01 MED ORDER — AZITHROMYCIN 250 MG PO TABS
1000.0000 mg | ORAL_TABLET | Freq: Once | ORAL | Status: AC
  Administered 2020-03-01: 1000 mg via ORAL
  Filled 2020-03-01: qty 4

## 2020-03-01 MED ORDER — LIDOCAINE HCL 1 % IJ SOLN
INTRAMUSCULAR | Status: AC
  Filled 2020-03-01: qty 20

## 2020-03-01 NOTE — ED Provider Notes (Signed)
[facility] DEPT Provider Note   CSN: [id number] Arrival date & time: 03/01/20  4098     History Chief Complaint  Patient presents with  . vaginal discharge    Kelly [name] is a 19 y.o. female.  The history is provided by the patient. No language interpreter was used.     19 year old female with prior history of STI including gonorrhea and chlamydia presenting complaining of vaginal discharge.  Patient report for the past week and a half she has noticed some increased vaginal discharge with foul odor.  She attributed to likely chlamydial infection as her significant other recently tested positive for chlamydia.  She does not have any fever chills no dysuria mild burning urination only, she denies any vaginal bleeding abdominal pain or rash.  She request not to have a pelvic examination and states she is currently on her menstruation.  She request just to be treated for chlamydia infection.  Past Medical History:  Diagnosis Date  . 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 Cannabis abuse 11/12/2015  . Chlamydia   . 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 GERD (gastroesophageal reflux disease)   . Gonorrhea   . Seizures [facility])    one seizure August 2016  . Vomiting     Patient Active Problem List   Diagnosis Date Noted  . Preterm premature rupture of membranes (PPROM) with unknown onset of labor 02/03/2018  . Supervision of other normal pregnancy, antepartum 09/29/2017  . Group B streptococcal bacteriuria 08/05/2017  . Chlamydia infection affecting pregnancy 07/13/2016  . Mood disorder ([place])   . 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 Cannabis abuse 11/12/2015  . 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 ODD (oppositional defiant disorder) 11/11/2015    Past Surgical History:  Procedure Laterality Date  . tubes in ears         OB History    Gravida  2   Para  1   Term  1   Preterm  0   AB  0   Living  2     SAB  0   TAB  0   Ectopic  0   Multiple  0   Live Births  1           Family History  Problem Relation Age of Onset  . Cancer Maternal Uncle   . Healthy Mother     Social History   Tobacco Use  . Smoking status: Current Every Day Smoker    Packs/day: 0.50    Types: Cigarettes    Last attempt to quit: 05/25/2016    Years since quitting: 3.7  . Smokeless tobacco: Never Used  Substance Use Topics  . Alcohol use: No  . Drug use: Not Currently    Types: Marijuana, Benzodiazepines    Home Medications Prior to Admission medications   Not on File    Allergies    Patient has no known allergies.  Review of Systems   Review of Systems  All other systems reviewed and are negative.   Physical Exam Updated Vital Signs BP 122/77 (BP Location: Left Arm)   Pulse 91   Temp (!) 97.5 F (36.4 C) (Oral)   Resp 16   Ht 5\' 6"  (1.676 m)   Wt 59.2 kg   LMP 03/01/2020   SpO2 100%   BMI 21.08 kg/m   Physical Exam Vitals and nursing note  reviewed.  Constitutional:      General: She is not in acute distress.    Appearance: She is well-developed.     Comments: Patient is resting comfortably in bed on the phone appears to be in no acute discomfort.  HENT:     Head: Atraumatic.  Eyes:     Conjunctiva/sclera: Conjunctivae normal.  Cardiovascular:     Rate and Rhythm: Normal rate and regular rhythm.     Pulses: Normal pulses.     Heart sounds: Normal heart sounds.  Pulmonary:     Breath sounds: Normal breath sounds.  Abdominal:     Palpations: Abdomen is soft.     Tenderness: There is no abdominal tenderness.  Genitourinary:    Comments: Pelvic examination is deferred Musculoskeletal:     Cervical back: Neck supple.  Skin:    Findings: No rash.  Neurological:     Mental Status: She is alert.     ED Results / Procedures / Treatments   Labs (all labs ordered  are listed, but only abnormal results are displayed) Labs Reviewed  RPR  HIV ANTIBODY (ROUTINE TESTING W REFLEX)  I-STAT BETA HCG BLOOD, ED ([facility], [facility], [facility] ONLY)    EKG None  Radiology No results found.  Procedures Procedures (including critical care time)  Medications Ordered in ED Medications - No data to display  ED Course  I have reviewed the triage vital signs and the nursing notes.  Pertinent labs & imaging results that were available during my care of the patient were reviewed by me and considered in my medical decision making (see chart for details).    MDM Rules/Calculators/A&P                      BP 122/77 (BP Location: Left Arm)   Pulse 91   Temp (!) 97.5 F (36.4 C) (Oral)   Resp 16   Ht 5\' 6"  (1.676 m)   Wt 59.2 kg   LMP 03/01/2020   SpO2 100%   BMI 21.08 kg/m   Final Clinical Impression(s) / ED Diagnoses Final diagnoses:  Sexually transmitted disease exposure    Rx / DC Orders ED Discharge Orders    None     8:33 AM Patient here complaining of vaginal discharge and request to be treated for chlamydia infection as a significant other recently tested positive for chlamydia infection.  She does not want a Pelvic examination at this time. Pt has a benign abdominal exam.  Will treat with Rocephin/Zithromax.    05/01/2020, PA-C 03/01/20 05/01/20    [id number], MD 03/01/20 1447

## 2020-03-01 NOTE — ED Triage Notes (Signed)
Patient c/o yellow vaginal discharge for an unknown amount of time. Patient states she was told by her sexual partner that he did not go and get treated when he knew he had chlamydia previously.

## 2020-12-01 ENCOUNTER — Encounter (HOSPITAL_COMMUNITY): Payer: Self-pay | Admitting: Emergency Medicine

## 2020-12-01 ENCOUNTER — Emergency Department (HOSPITAL_COMMUNITY)
Admission: EM | Admit: 2020-12-01 | Discharge: 2020-12-01 | Disposition: A | Discharge status: Home / Self Care | Payer: Medicaid Other | Attending: Emergency Medicine | Admitting: Emergency Medicine

## 2020-12-01 ENCOUNTER — Other Ambulatory Visit: Payer: Self-pay

## 2020-12-01 DIAGNOSIS — F17210 Nicotine dependence, cigarettes, uncomplicated: Secondary | ICD-10-CM | POA: Insufficient documentation

## 2020-12-01 DIAGNOSIS — Z20822 Contact with and (suspected) exposure to covid-19: Secondary | ICD-10-CM | POA: Insufficient documentation

## 2020-12-01 DIAGNOSIS — J069 Acute upper respiratory infection, unspecified: Secondary | ICD-10-CM

## 2020-12-01 DIAGNOSIS — R590 Localized enlarged lymph nodes: Secondary | ICD-10-CM | POA: Insufficient documentation

## 2020-12-01 LAB — GROUP A STREP BY PCR: Group A Strep by PCR: NOT DETECTED

## 2020-12-01 LAB — RESP PANEL BY RT-PCR (FLU A&B, COVID) ARPGX2
Influenza A by PCR: NEGATIVE
Influenza B by PCR: NEGATIVE
SARS Coronavirus 2 by RT PCR: NEGATIVE

## 2020-12-01 NOTE — ED Notes (Signed)
Patient declined vital signs at time of discharge

## 2020-12-01 NOTE — Discharge Instructions (Signed)
Your strep test was negative today.  You will be called if your Covid or flu results are positive.  The swollen lymph node that you have noted is likely in the setting of a viral infection and should improve as your symptoms are resolved.  Monitor this and if it does not go away you should follow-up closely with your primary care doctor for reevaluation or if you note other enlarged lymph nodes elsewhere on her body.  Try to leave this area alone if you continue to manipulate the lymph node it will remain inflamed.  You can use Motrin, Tylenol, ice or heat over the area as well.  Continue to treat your URI symptoms supportively.

## 2020-12-01 NOTE — ED Triage Notes (Signed)
Patient states she had a sore throat, felt like her 'ears were blocked' and a cold around 12/23, and noticed her L submandibular lymph node was swollen and has stayed swollen, denies worsening or pain. Also states her top molar hurts every time she drinks water.

## 2020-12-01 NOTE — ED Provider Notes (Signed)
[facility]-EMERGENCY DEPT Provider Note   CSN: [id number] Arrival date & time: 12/01/20  3614     History Chief Complaint  Patient presents with  . Sore Throat    Kelly [name] is a 20 y.o. female.  Kelly [name] is a 20 y.o. female with a history of seizures, GERD, polysubstance abuse, who presents to the emergency department for evaluation of sore throat, and nasal congestion and concern for enlarged lymph node.  Patient states that the week before Christmas she was sick with "cold-like symptoms".  She reports she had congestion and rhinorrhea, reports feeling like her ear was stopped up and having a sore throat.  She also reports an intermittent dry nonproductive cough.  She reports those symptoms overall have been improving, she still has a faint cough and occasional sore throat but she noticed a lymph node under her chin on the left that has been swollen and tender and has not seem to go away.  She states it is worse when she keeps touching it and messing with it over and over again.  She has not had any fevers or chills.  No neck swelling or difficulty swallowing.  No chest pain or shortness of breath.  No abdominal pain, nausea, vomiting or diarrhea.  Has not had any Covid or flu testing but does not think this could be what is happening.  Patient has not had any Covid vaccinations.  Has not noted any swollen lymph nodes elsewhere over her body.  Also reports that she has some bad teeth and has intermittently been having some pain over her upper left molar but no surrounding swelling or drainage.  No other aggravating or alleviating factors.        Past Medical History:  Diagnosis Date  . 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 Cannabis abuse 11/12/2015  . Chlamydia   . 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 GERD (gastroesophageal reflux disease)   . Gonorrhea   . Seizures  [facility])     one seizure August 2016  . Vomiting     Patient Active Problem List   Diagnosis Date Noted  . Preterm premature rupture of membranes (PPROM) with unknown onset of labor 02/03/2018  . Supervision of other normal pregnancy, antepartum 09/29/2017  . Group B streptococcal bacteriuria 08/05/2017  . Chlamydia infection affecting pregnancy 07/13/2016  . Mood disorder ([facility])   . 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 Cannabis abuse 11/12/2015  . 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 ODD (oppositional defiant disorder) 11/11/2015    Past Surgical History:  Procedure Laterality Date  . tubes in ears       OB History    Gravida  2   Para  1   Term  1   Preterm  0   AB  0   Living  2     SAB  0   IAB  0   Ectopic  0   Multiple  0   Live Births  1           Family History  Problem Relation Age of Onset  . Cancer Maternal Uncle   . Healthy Mother     Social History   Tobacco Use  . Smoking status: Current Every Day Smoker    Packs/day: 0.50    Types: Cigarettes    Last attempt to quit: 05/25/2016    Years since  quitting: 4.5  . Smokeless tobacco: Never Used  Vaping Use  . Vaping Use: Never used  Substance Use Topics  . Alcohol use: No  . Drug use: Not Currently    Types: Marijuana, Benzodiazepines    Home Medications Prior to Admission medications   Not on File    Allergies    Patient has no known allergies.  Review of Systems   Review of Systems  Constitutional: Negative for chills and fever.  HENT: Positive for congestion, ear pain, postnasal drip, rhinorrhea, sinus pressure and sinus pain. Negative for sore throat.   Respiratory: Negative for cough and shortness of breath.   Cardiovascular: Negative for chest pain.  Gastrointestinal: Negative for abdominal pain, nausea and vomiting.  Genitourinary: Negative for dysuria and frequency.  Musculoskeletal: Negative for arthralgias  and myalgias.  Skin: Negative for color change and rash.  Neurological: Negative for dizziness, syncope, light-headedness and headaches.    Physical Exam Updated Vital Signs BP 122/69 (BP Location: Right Arm)   Pulse (!) 102   Temp 98.2 F (36.8 C) (Oral)   Resp 16   Ht 5\' 6"  (1.676 m)   Wt 54.4 kg   SpO2 99%   BMI 19.37 kg/m   Physical Exam Vitals and nursing note reviewed.  Constitutional:      General: She is not in acute distress.    Appearance: She is well-developed and well-nourished. She is not diaphoretic.  HENT:     Head: Normocephalic and atraumatic.     Right Ear: Tympanic membrane and ear canal normal.     Left Ear: Tympanic membrane and ear canal normal.     Nose: Congestion and rhinorrhea present.     Mouth/Throat:     Mouth: Mucous membranes are moist.     Pharynx: Oropharynx is clear. Posterior oropharyngeal erythema present.     Tonsils: No tonsillar exudate or tonsillar abscesses.     Comments: Posterior oropharynx clear and mucous membranes moist, there is mild erythema but no edema or tonsillar exudates, uvula midline, normal phonation, no trismus, tolerating secretions without difficulty.  Eyes:     General:        Right eye: No discharge.        Left eye: No discharge.     Extraocular Movements: EOM normal.     Pupils: Pupils are equal, round, and reactive to light.  Neck:     Comments: Small palpable submandibular lymph node on the left that is soft and easily movable, no other palpable lymphadenopathy noted Cardiovascular:     Rate and Rhythm: Normal rate and regular rhythm.     Pulses: Intact distal pulses.     Heart sounds: Normal heart sounds. No murmur heard. No friction rub. No gallop.   Pulmonary:     Effort: Pulmonary effort is normal. No respiratory distress.     Breath sounds: Normal breath sounds. No wheezing or rales.     Comments: Respirations equal and unlabored, patient able to speak in full sentences, lungs clear to auscultation  bilaterally  Abdominal:     General: Bowel sounds are normal. There is no distension.     Palpations: Abdomen is soft. There is no mass.     Tenderness: There is no abdominal tenderness. There is no guarding.     Comments: Abdomen soft, nondistended, nontender to palpation in all quadrants without guarding or peritoneal signs   Musculoskeletal:        General: No deformity or edema.  Cervical back: Neck supple.  Skin:    General: Skin is warm and dry.     Capillary Refill: Capillary refill takes less than 2 seconds.  Neurological:     Mental Status: She is alert.     Coordination: Coordination normal.     Comments: Speech is clear, able to follow commands Moves extremities without ataxia, coordination intact  Psychiatric:        Mood and Affect: Mood normal.        Behavior: Behavior normal.     ED Results / Procedures / Treatments   Labs (all labs ordered are listed, but only abnormal results are displayed) Labs Reviewed  RESP PANEL BY RT-PCR (FLU A&B, COVID) [id number]  GROUP A STREP BY PCR    EKG None  Radiology No results found.  Procedures Procedures (including critical care time)  Medications Ordered in ED Medications - No data to display  ED Course  I have reviewed the triage vital signs and the nursing notes.  Pertinent labs & imaging results that were available during my care of the patient were reviewed by me and considered in my medical decision making (see chart for details).    MDM Rules/Calculators/A&P                         20 year old female who had URI symptoms the week before Christmas, overall improving aside from mild sore throat and cough.  Patient noticed a swollen lymph node on the left side of her neck that concerned her.  On exam she does have a small soft and easily movable swollen submandibular lymph node that is mildly tender.  No other lymphadenopathy noted.  I suspect this is likely enlarged in the setting of recent URI, patient's strep  test is negative today, Covid and flu testing are negative as well.  Patient is not having any neck swelling, stridor or difficulty swallowing or changes in speech.  Encouraged her to use warm or cool compresses, NSAIDs and Tylenol and follow-up closely with her PCP if this is not improving.  She expresses understanding and agreement with plan.  Discharged home in good condition.  Final Clinical Impression(s) / ED Diagnoses Final diagnoses:  Viral upper respiratory tract infection  Lymphadenopathy of left cervical region    Rx / DC Orders ED Discharge Orders    None       Legrand Rams 12/01/20 1631    Terrilee Files, MD 12/02/20 1032

## 2020-12-06 ENCOUNTER — Ambulatory Visit (HOSPITAL_COMMUNITY)
Admission: EM | Admit: 2020-12-06 | Discharge: 2020-12-06 | Disposition: A | Discharge status: Home / Self Care | Payer: Medicaid Other | Attending: Urgent Care | Admitting: Urgent Care

## 2020-12-06 ENCOUNTER — Other Ambulatory Visit: Payer: Self-pay

## 2020-12-06 ENCOUNTER — Encounter (HOSPITAL_COMMUNITY): Payer: Self-pay | Admitting: Emergency Medicine

## 2020-12-06 DIAGNOSIS — J019 Acute sinusitis, unspecified: Secondary | ICD-10-CM | POA: Insufficient documentation

## 2020-12-06 DIAGNOSIS — J029 Acute pharyngitis, unspecified: Secondary | ICD-10-CM

## 2020-12-06 DIAGNOSIS — R5383 Other fatigue: Secondary | ICD-10-CM | POA: Insufficient documentation

## 2020-12-06 DIAGNOSIS — R591 Generalized enlarged lymph nodes: Secondary | ICD-10-CM | POA: Insufficient documentation

## 2020-12-06 DIAGNOSIS — R5381 Other malaise: Secondary | ICD-10-CM

## 2020-12-06 DIAGNOSIS — R519 Headache, unspecified: Secondary | ICD-10-CM

## 2020-12-06 DIAGNOSIS — R070 Pain in throat: Secondary | ICD-10-CM | POA: Insufficient documentation

## 2020-12-06 LAB — POCT INFECTIOUS MONO SCREEN, ED / UC: Mono Screen: NEGATIVE

## 2020-12-06 LAB — CBC WITH DIFFERENTIAL/PLATELET
Abs Immature Granulocytes: 0.07 10*3/uL (ref 0.00–0.07)
Basophils Absolute: 0 10*3/uL (ref 0.0–0.1)
Basophils Relative: 0 %
Eosinophils Absolute: 0.1 10*3/uL (ref 0.0–0.5)
Eosinophils Relative: 2 %
HCT: 44 % (ref 36.0–46.0)
Hemoglobin: 14.9 g/dL (ref 12.0–15.0)
Immature Granulocytes: 1 %
Lymphocytes Relative: 13 %
Lymphs Abs: 1.1 10*3/uL (ref 0.7–4.0)
MCH: 30.3 pg (ref 26.0–34.0)
MCHC: 33.9 g/dL (ref 30.0–36.0)
MCV: 89.4 fL (ref 80.0–100.0)
Monocytes Absolute: 0.8 10*3/uL (ref 0.1–1.0)
Monocytes Relative: 9 %
Neutro Abs: 6.7 10*3/uL (ref 1.7–7.7)
Neutrophils Relative %: 75 %
Platelets: 281 10*3/uL (ref 150–400)
RBC: 4.92 MIL/uL (ref 3.87–5.11)
RDW: 12.7 % (ref 11.5–15.5)
WBC: 8.8 10*3/uL (ref 4.0–10.5)
nRBC: 0 % (ref 0.0–0.2)

## 2020-12-06 LAB — HIV ANTIBODY (ROUTINE TESTING W REFLEX): HIV Screen 4th Generation wRfx: NONREACTIVE

## 2020-12-06 LAB — POCT RAPID STREP A, ED / UC: Streptococcus, Group A Screen (Direct): NEGATIVE

## 2020-12-06 MED ORDER — NAPROXEN 500 MG PO TABS: 500.0000 mg | ORAL_TABLET | Freq: Two times a day (BID) | ORAL | 0 refills | Status: DC

## 2020-12-06 MED ORDER — AMOXICILLIN 875 MG PO TABS: 875.0000 mg | ORAL_TABLET | Freq: Two times a day (BID) | ORAL | 0 refills | Status: DC

## 2020-12-06 NOTE — ED Triage Notes (Signed)
Pt presents with headache and swollen lymph nodes. Was seen in ED on 12/01/20 with negative strep test. States left side started swelling on 11/21/20, right side started today. States tylenol gives no relief.

## 2020-12-06 NOTE — ED Provider Notes (Signed)
[facility]   MRN: [id number] DOB: 23-Dec-2000  Subjective:   Kelly [name] is a 20 y.o. female presenting for 2-week history of persistent malaise, lymph node swelling, intermittent sinus headaches, scratchy throat pain, nasal congestion, left ear pain.  Patient states that she had a negative COVID test, negative strep test this past week.  Denies history of HIV.  Denies cough, chest pain, shortness of breath, body aches.  No current facility-administered medications for this encounter. No current outpatient medications on file.   No Known Allergies  Past Medical History:  Diagnosis Date  . 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 Cannabis abuse 11/12/2015  . Chlamydia   . 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 GERD (gastroesophageal reflux disease)   . Gonorrhea   . Seizures [facility])    one seizure August 2016  . Vomiting      Past Surgical History:  Procedure Laterality Date  . tubes in ears      Family History  Problem Relation Age of Onset  . Cancer Maternal Uncle   . Healthy Mother     Social History   Tobacco Use  . Smoking status: Current Every Day Smoker    Packs/day: 0.50    Types: Cigarettes    Last attempt to quit: 05/25/2016    Years since quitting: 4.5  . Smokeless tobacco: Never Used  Vaping Use  . Vaping Use: Never used  Substance Use Topics  . Alcohol use: No  . Drug use: Not Currently    Types: Marijuana, Benzodiazepines    ROS   Objective:   Vitals: BP 121/65 (BP Location: Right Arm)   Pulse (!) 110   Temp 98.3 F (36.8 C) (Oral)   Resp 18   LMP 11/13/2020   SpO2 100%   Physical Exam Constitutional:      General: She is not in acute distress.    Appearance: She is well-developed. She is not ill-appearing.  HENT:     Head: Normocephalic and atraumatic.     Right Ear: Tympanic membrane and ear canal normal. No drainage or tenderness. No middle  ear effusion. Tympanic membrane is not erythematous.     Left Ear: Tympanic membrane and ear canal normal. No drainage or tenderness.  No middle ear effusion. Tympanic membrane is not erythematous.     Nose: No congestion or rhinorrhea.     Mouth/Throat:     Mouth: Mucous membranes are moist. No oral lesions.     Pharynx: Oropharynx is clear. No pharyngeal swelling, oropharyngeal exudate, posterior oropharyngeal erythema or uvula swelling.     Tonsils: No tonsillar exudate or tonsillar abscesses.  Eyes:     Extraocular Movements:     Right eye: Normal extraocular motion.     Left eye: Normal extraocular motion.     Conjunctiva/sclera: Conjunctivae normal.     Pupils: Pupils are equal, round, and reactive to light.  Cardiovascular:     Rate and Rhythm: Normal rate.  Pulmonary:     Effort: Pulmonary effort is normal.  Chest:  Breasts:     Right: No axillary adenopathy or supraclavicular adenopathy.     Left: No axillary adenopathy or supraclavicular adenopathy.    Musculoskeletal:     Cervical back: Normal range of motion and neck supple.  Lymphadenopathy:     Head:     Right side of head: No submental, submandibular, tonsillar, preauricular, posterior auricular or occipital adenopathy.  Left side of head: Submental adenopathy present. No submandibular, tonsillar, preauricular, posterior auricular or occipital adenopathy.     Cervical: Cervical adenopathy present.     Right cervical: Superficial cervical adenopathy present. No deep or posterior cervical adenopathy.    Left cervical: Superficial cervical adenopathy present. No deep or posterior cervical adenopathy.     Upper Body:     Right upper body: No supraclavicular or axillary adenopathy.     Left upper body: No supraclavicular or axillary adenopathy.  Skin:    General: Skin is warm and dry.  Neurological:     General: No focal deficit present.     Mental Status: She is alert and oriented to person, place, and time.   Psychiatric:        Mood and Affect: Mood normal.        Behavior: Behavior normal.     Results for orders placed or performed during the hospital encounter of 12/06/20 (from the past 24 hour(s))  POCT Rapid Strep A     Status: None   Collection Time: 12/06/20  1:45 PM  Result Value Ref Range   Streptococcus, Group A Screen (Direct) NEGATIVE NEGATIVE  POCT Infectious Mono Screen     Status: None   Collection Time: 12/06/20  1:53 PM  Result Value Ref Range   Mono Screen NEGATIVE NEGATIVE    Assessment and Plan :   PDMP not reviewed this encounter.  1. Lymphadenopathy   2. Throat pain   3. Malaise and fatigue   4. Acute non-recurrent sinusitis, unspecified location     Labs pending, will cover for sinusitis with amoxicillin. Use naproxen for pain and inflammation.  We will follow-up with patient regarding any abnormal labs.  Otherwise she is to establish care with [facility] to get good follow-up on her lymphadenopathy. Counseled patient on potential for adverse effects with medications prescribed/recommended today, ER and return-to-clinic precautions discussed, patient verbalized understanding.    Wallis Bamberg, PA-C 12/06/20 1402

## 2020-12-08 LAB — CULTURE, GROUP A STREP (THRC)

## 2021-01-02 DIAGNOSIS — Z20822 Contact with and (suspected) exposure to covid-19: Secondary | ICD-10-CM | POA: Diagnosis not present

## 2021-04-18 ENCOUNTER — Other Ambulatory Visit: Payer: Self-pay

## 2021-04-18 ENCOUNTER — Ambulatory Visit
Admission: EM | Admit: 2021-04-18 | Discharge: 2021-04-18 | Disposition: A | Discharge status: Home / Self Care | Payer: Medicaid Other | Attending: Family Medicine | Admitting: Family Medicine

## 2021-04-18 DIAGNOSIS — R591 Generalized enlarged lymph nodes: Secondary | ICD-10-CM | POA: Insufficient documentation

## 2021-04-18 DIAGNOSIS — N760 Acute vaginitis: Secondary | ICD-10-CM

## 2021-04-18 DIAGNOSIS — J029 Acute pharyngitis, unspecified: Secondary | ICD-10-CM | POA: Insufficient documentation

## 2021-04-18 MED ORDER — IPRATROPIUM BROMIDE 0.03 % NA SOLN: 2.0000 | Freq: Two times a day (BID) | NASAL | 0 refills | Status: DC | PRN

## 2021-04-18 MED ORDER — FLUCONAZOLE 150 MG PO TABS: 150.0000 mg | ORAL_TABLET | Freq: Once | ORAL | 0 refills | Status: AC

## 2021-04-18 MED ORDER — FAMOTIDINE 40 MG PO TABS: 40.0000 mg | ORAL_TABLET | Freq: Two times a day (BID) | ORAL | 0 refills | Status: DC

## 2021-04-18 MED ORDER — LEVOCETIRIZINE DIHYDROCHLORIDE 5 MG PO TABS: 5.0000 mg | ORAL_TABLET | Freq: Every evening | ORAL | 0 refills | Status: DC

## 2021-04-18 NOTE — Discharge Instructions (Addendum)
Start famotidine 40 mg twice daily as needed for throat irritation.  For nasal symptoms I prescribed Atrovent nasal spray you may use 2 times daily as needed as well as levocetirizine to take at bedtime for nasal drainage.  For your vaginal discharge I have also prescribed you fluconazole take 1 tablet today may repeat in 3 days if needed.  Your test results should be available within the next 24 to 48 hours.  If any additional treatment is warranted we will notify you by phone.

## 2021-04-18 NOTE — ED Triage Notes (Signed)
Pt c/o sore throat x 2 weeks. She states she thinks she has tonsillitis. Pt c/o blockage on left ear. She states her lymph nodes are swollen. Pt states she has been having vaginal itching and discharge.

## 2021-04-18 NOTE — ED Provider Notes (Signed)
[facility]    CSN: [id number] Arrival date & time: 04/18/21  1306      History   Chief Complaint Chief Complaint  Patient presents with  . Sore Throat  . Vaginal Discharge  . Ear Pain    HPI Kelly [name] is a 20 y.o. female.   HPI  Patient presents today with sore throat x2 weeks and vaginitis symptoms.  Patient has had problems with her throat in cervical adenopathy ongoing for several months.  Previously tested for both Strep and Mono both negative.  She has not had any recent STD testing of the throat. Patient is afebrile.Reports that she has had history of tonsillitis.  She also has chronic nasal congestion and drainage symptoms.  She does not take allergy medicine year-round.  She is a chronic smoker of marijuana. She is also experiencing vaginal discharge and irritation. She has a history of STI including chlamydia and gonorrhea. Past Medical History:  Diagnosis Date  . 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 Cannabis abuse 11/12/2015  . Chlamydia   . 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 GERD (gastroesophageal reflux disease)   . Gonorrhea   . Seizures [facility])    one seizure August 2016  . Vomiting     Patient Active Problem List   Diagnosis Date Noted  . Preterm premature rupture of membranes (PPROM) with unknown onset of labor 02/03/2018  . Supervision of other normal pregnancy, antepartum 09/29/2017  . Group B streptococcal bacteriuria 08/05/2017  . Chlamydia infection affecting pregnancy 07/13/2016  . Mood disorder (HCC)   . 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 Cannabis abuse 11/12/2015  . 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 ODD (oppositional defiant disorder) 11/11/2015    Past Surgical History:  Procedure Laterality Date  . tubes in ears      OB History    Gravida  2   Para  1   Term  1   Preterm   0   AB  0   Living  2     SAB  0   IAB  0   Ectopic  0   Multiple  0   Live Births  1            Home Medications    Prior to Admission medications   Medication Sig Start Date End Date Taking? Authorizing Provider  amoxicillin (AMOXIL) 875 MG tablet Take 1 tablet (875 mg total) by mouth 2 (two) times daily. 12/06/20   Wallis Bamberg, PA-C  naproxen (NAPROSYN) 500 MG tablet Take 1 tablet (500 mg total) by mouth 2 (two) times daily with a meal. 12/06/20   Wallis Bamberg, PA-C    Family History Family History  Problem Relation Age of Onset  . Cancer Maternal Uncle   . Healthy Mother     Social History Social History   Tobacco Use  . Smoking status: Current Every Day Smoker    Packs/day: 0.50    Types: Cigarettes    Last attempt to quit: 05/25/2016    Years since quitting: 4.9  . Smokeless tobacco: Never Used  Vaping Use  . Vaping Use: Never used  Substance Use Topics  . Alcohol use: No  . Drug use: Not Currently    Types: Marijuana, Benzodiazepines     Allergies   Patient has no known allergies.   Review of Systems Review of Systems Pertinent negatives listed  in HPI  Physical Exam Triage Vital Signs ED Triage Vitals  Enc Vitals Group     BP 04/18/21 1455 112/77     Pulse Rate 04/18/21 1455 77     Resp 04/18/21 1455 18     Temp 04/18/21 1455 98.2 F (36.8 C)     Temp Source 04/18/21 1455 Oral     SpO2 04/18/21 1455 97 %     Weight --      Height --      Head Circumference --      Peak Flow --      Pain Score 04/18/21 1453 7     Pain Loc --      Pain Edu? --      Excl. in GC? --    No data found.  Updated Vital Signs BP 112/77 (BP Location: Left Arm)   Pulse 77   Temp 98.2 F (36.8 C) (Oral)   Resp 18   LMP 04/15/2021 (Exact Date)   SpO2 97%   Visual Acuity Right Eye Distance:   Left Eye Distance:   Bilateral Distance:    Right Eye Near:   Left Eye Near:    Bilateral Near:     Physical Exam  General Appearance:    Alert,  cooperative, no distress  HENT:   Normocephalic, ears normal, nares mucosal edema with congestion, rhinorrhea, oropharynx    Eyes:    PERRL, conjunctiva/corneas clear, EOM's intact       Lungs:     Clear to auscultation bilaterally, respirations unlabored  Heart:    Regular rate and rhythm  Neurologic:   Awake, alert, oriented x 3. No apparent focal neurological           defect.    Vaginal cytology self-collected UC Treatments / Results  Labs (all labs ordered are listed, but only abnormal results are displayed) Labs Reviewed  CERVICOVAGINAL ANCILLARY ONLY    EKG   Radiology No results found.  Procedures Procedures (including critical care time)  Medications Ordered in UC Medications - No data to display  Initial Impression / Assessment and Plan / UC Course  I have reviewed the triage vital signs and the nursing notes.  Pertinent labs & imaging results that were available during my care of the patient were reviewed by me and considered in my medical decision making (see chart for details).     Acute pharyngitis and cervical adenopathy, recurrent ongoing Suspect this may be inflammatory related to chronic smoking and or postnasal drainage however we will collect a throat culture as patient has a history of strep B and collected a cytology sample of her throat to rule out any STIs as a source of recurrent sore throat.  Will trial a course of famotidine for esophagitis and levocetirizine with Atrovent for nasal symptoms.  ENT evaluation if symptoms persist. STI testing pending fluconazole for vaginitis while awaiting results.   Final Clinical Impressions(s) / UC Diagnoses   Final diagnoses:  Vaginitis and vulvovaginitis  Lymphadenopathy  Acute pharyngitis, unspecified etiology     Discharge Instructions     Start famotidine 40 mg twice daily as needed for throat irritation.  For nasal symptoms I prescribed Atrovent nasal spray you may use 2 times daily as needed as well  as levocetirizine to take at bedtime for nasal drainage.  For your vaginal discharge I have also prescribed you fluconazole take 1 tablet today may repeat in 3 days if needed.  Your test results should be available  within the next 24 to 48 hours.  If any additional treatment is warranted we will notify you by phone.    ED Prescriptions    Medication Sig Dispense Auth. Provider   famotidine (PEPCID) 40 MG tablet Take 1 tablet (40 mg total) by mouth 2 (two) times daily. 60 tablet Bing Neighbors, FNP   ipratropium (ATROVENT) 0.03 % nasal spray Place 2 sprays into both nostrils 2 (two) times daily as needed for rhinitis. 30 mL Bing Neighbors, FNP   fluconazole (DIFLUCAN) 150 MG tablet Take 1 tablet (150 mg total) by mouth once for 1 dose. Repeat if needed 2 tablet Bing Neighbors, FNP   levocetirizine (XYZAL) 5 MG tablet Take 1 tablet (5 mg total) by mouth every evening. 30 tablet Bing Neighbors, FNP     PDMP not reviewed this encounter.   Bing Neighbors, FNP 04/18/21 [phone]

## 2021-04-21 LAB — CERVICOVAGINAL ANCILLARY ONLY
Bacterial Vaginitis (gardnerella): POSITIVE — AB
Candida Glabrata: NEGATIVE
Candida Vaginitis: POSITIVE — AB
Chlamydia: NEGATIVE
Comment: NEGATIVE
Comment: NEGATIVE
Comment: NEGATIVE
Comment: NEGATIVE
Comment: NEGATIVE
Comment: NORMAL
Neisseria Gonorrhea: NEGATIVE
Trichomonas: NEGATIVE

## 2021-04-21 LAB — CYTOLOGY, (ORAL, ANAL, URETHRAL) ANCILLARY ONLY
Chlamydia: NEGATIVE
Comment: NEGATIVE
Comment: NEGATIVE
Comment: NORMAL
Neisseria Gonorrhea: NEGATIVE
Trichomonas: NEGATIVE

## 2021-04-21 LAB — CULTURE, GROUP A STREP (THRC)

## 2021-04-22 ENCOUNTER — Telehealth (HOSPITAL_COMMUNITY): Payer: Self-pay | Admitting: Emergency Medicine

## 2021-04-22 MED ORDER — METRONIDAZOLE 500 MG PO TABS: 500.0000 mg | ORAL_TABLET | Freq: Two times a day (BID) | ORAL | 0 refills | Status: DC

## 2021-06-14 ENCOUNTER — Encounter: Payer: Self-pay | Admitting: Emergency Medicine

## 2021-06-14 ENCOUNTER — Ambulatory Visit
Admission: EM | Admit: 2021-06-14 | Discharge: 2021-06-14 | Disposition: A | Discharge status: Home / Self Care | Payer: Medicaid Other | Attending: Family Medicine | Admitting: Family Medicine

## 2021-06-14 ENCOUNTER — Other Ambulatory Visit: Payer: Self-pay

## 2021-06-14 DIAGNOSIS — J029 Acute pharyngitis, unspecified: Secondary | ICD-10-CM | POA: Diagnosis not present

## 2021-06-14 DIAGNOSIS — K209 Esophagitis, unspecified without bleeding: Secondary | ICD-10-CM | POA: Insufficient documentation

## 2021-06-14 DIAGNOSIS — R059 Cough, unspecified: Secondary | ICD-10-CM

## 2021-06-14 DIAGNOSIS — N760 Acute vaginitis: Secondary | ICD-10-CM | POA: Diagnosis not present

## 2021-06-14 LAB — POCT RAPID STREP A (OFFICE): Rapid Strep A Screen: NEGATIVE

## 2021-06-14 MED ORDER — METRONIDAZOLE 500 MG PO TABS: 500.0000 mg | ORAL_TABLET | Freq: Two times a day (BID) | ORAL | 0 refills | Status: DC

## 2021-06-14 MED ORDER — HYDROCORTISONE 1 % EX CREA: TOPICAL_CREAM | CUTANEOUS | 0 refills | Status: DC

## 2021-06-14 MED ORDER — FAMOTIDINE 40 MG PO TABS: 80.0000 mg | ORAL_TABLET | Freq: Every day | ORAL | 0 refills | Status: DC

## 2021-06-14 MED ORDER — FLUCONAZOLE 150 MG PO TABS
150.0000 mg | ORAL_TABLET | Freq: Every day | ORAL | 0 refills | Status: DC
Start: 2021-06-14 — End: 2024-09-07

## 2021-06-14 NOTE — ED Triage Notes (Signed)
Patient c/o sore throat since December "on and off".   Patient denies fever at home.   Patient endorses having rash on ABD. Patient denies itchiness.   Patient endorses hoarseness.   Patient endorses she was seen previously at this clinic in May for similar symptoms.   Patient wants STD Testing.   Patient has used salt water gargles with no relief of symptoms.

## 2021-06-14 NOTE — ED Provider Notes (Signed)
[facility]    CSN: [id number] Arrival date & time: 06/14/21  1456      History   Chief Complaint Chief Complaint  Patient presents with   Sore Throat   STI Testing    HPI Kelly [name] is a 20 y.o. female.   HPI Patient presents today with sore throat.  She was seen back in May for the same issue however she reports that her insurance would not cover her medications and her symptoms have progressively worsened.  She continues to complain of burning, redness in the back of her throat with enlarged lymph nodes. She is also wanting STD testing.  She reports she has not been recently sexually active with anyone how for she has some vaginal discharge and discomfort has been present for a while.  She was unable to get her medications previously. She denies fever or any associated URI symptoms.  She also complains of a rash on her torso.  The rash has been present for the last few days.  It is not itchy is localized to her torso and abdomen.  No history of dermatitis or diagnosis of eczema.  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   GERD (gastroesophageal reflux disease)    Gonorrhea    Seizures (HCC)    one seizure August 2016   Vomiting     Patient Active Problem List   Diagnosis Date Noted   Preterm premature rupture of membranes (PPROM) with unknown onset of labor 02/03/2018   Supervision of other normal pregnancy, antepartum 09/29/2017   Group B streptococcal bacteriuria 08/05/2017   Chlamydia infection affecting pregnancy 07/13/2016   Mood disorder (HCC)    Disruptive, impulse control, 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 Authorizing Provider  famotidine (PEPCID) 40 MG tablet Take 2 tablets (80 mg total) by mouth at bedtime for 14 days. 06/14/21 06/28/21 Yes Bing Neighbors, FNP  fluconazole (DIFLUCAN) 150 MG tablet Take 1 tablet (150 mg total) by mouth daily. 06/14/21  Yes Bing Neighbors, FNP  hydrocortisone cream 1 % Apply to affected area 2 times daily 06/14/21  Yes Bing Neighbors, FNP  amoxicillin (AMOXIL) 875 MG tablet Take 1 tablet (875 mg total) by mouth 2 (two) times daily. 12/06/20   Wallis Bamberg, PA-C  ipratropium (ATROVENT) 0.03 % nasal spray Place 2 sprays into both nostrils 2 (two) times daily as needed for rhinitis. 04/18/21   Bing Neighbors, FNP  levocetirizine (XYZAL) 5 MG tablet Take 1 tablet (5 mg total) by mouth every evening. 04/18/21  Bing Neighbors, FNP  metroNIDAZOLE (FLAGYL) 500 MG tablet Take 1 tablet (500 mg total) by mouth 2 (two) times daily. 06/14/21   Bing Neighbors, FNP  naproxen (NAPROSYN) 500 MG tablet Take 1 tablet (500 mg total) by mouth 2 (two) times daily with a meal. 12/06/20   Wallis Bamberg, PA-C    Family History Family History  Problem Relation Age of Onset   Cancer Maternal Uncle    Healthy Mother     Social History Social History   Tobacco Use   Smoking status: Every Day    Packs/day: 0.50    Types: Cigarettes    Last attempt to quit: 05/25/2016    Years since quitting: 5.0   Smokeless tobacco: Never  Vaping Use   Vaping Use: Never used  Substance Use Topics   Alcohol use: No   Drug use: Not Currently    Types: Marijuana, Benzodiazepines     Allergies   Patient has no known  allergies.   Review of Systems Review of Systems Pertinent negatives listed in HPI   Physical Exam Triage Vital Signs ED Triage Vitals  Enc Vitals Group     BP 06/14/21 1613 123/87     Pulse Rate 06/14/21 1613 89     Resp 06/14/21 1613 14     Temp 06/14/21 1613 98.6 F (37 C)     Temp Source 06/14/21 1613 Oral     SpO2 06/14/21 1613 98 %     Weight --      Height --      Head Circumference --      Peak Flow --      Pain Score 06/14/21 1601 10     Pain Loc --      Pain Edu? --      Excl. in GC? --    No data found.  Updated Vital Signs BP 123/87 (BP Location: Left Arm)   Pulse 89   Temp 98.6 F (37 C) (Oral)   Resp 14   LMP 06/12/2021 (Approximate)   SpO2 98%   Visual Acuity Right Eye Distance:   Left Eye Distance:   Bilateral Distance:    Right Eye Near:   Left Eye Near:    Bilateral Near:     Physical Exam Constitutional: Patient appears well-developed and well-nourished. No distress. HENT: Normocephalic, atraumatic, External right and left ear normal. Oropharynx erythematous with vesicular lesions and ulcerations in the posterior oropharynx Eyes: Conjunctivae and EOM are normal. PERRLA, no scleral icterus. Neck: Normal ROM. Neck supple. No JVD. No tracheal deviation. No thyromegaly. CVS: RRR, S1/S2 +, no murmurs, no gallops, no carotid bruit.  Pulmonary: Effort and breath sounds normal, no stridor, rhonchi, wheezes, rales.  Musculoskeletal: Normal range of motion. No edema and no tenderness.  Neuro: Alert. Normal reflexes, muscle tone coordination. No cranial nerve deficit. Skin: Skin is warm and dry.  Patch like macular rash distributed on abdomen and lower torso Psychiatric: Normal mood and affect. Behavior, judgment, thought content normal.   UC Treatments / Results  Labs (all labs ordered are listed, but only abnormal results are displayed) Labs Reviewed  CERVICOVAGINAL ANCILLARY ONLY - Abnormal; Notable for the following components:      Result  Value   Candida Vaginitis Positive (*)    All other components within normal limits  HSV CULTURE AND TYPING  POCT RAPID STREP A (OFFICE)  CYTOLOGY, (ORAL, ANAL, URETHRAL) ANCILLARY ONLY    EKG   Radiology No results found.  Procedures Procedures (including critical care time)  Medications Ordered in UC Medications - No data to display  Initial Impression / Assessment and Plan / UC Course  I have reviewed the triage vital signs and the nursing notes.  Pertinent labs & imaging results that were available during my care of the patient were reviewed by me and considered in my medical decision making (see chart for details).    Viral pharyngitis, rapid strep negative, HSV culture is pending this patient has atypical ulcerations posterior oropharynx and lesions which appear that they could be vesicular therefore unroofed a vesicular lesion and collected pathology.  Patient's cervical cytology was positive previously for BV and yeast therefore will cover for both today.  Cortisone cream for rash patient is able to get her medications today.  Patient advised we will notify her if any of her results are abnormal.  Return precautions given. Final Clinical Impressions(s) / UC Diagnoses   Final diagnoses:  Viral pharyngitis  Cough  Acute esophagitis  Vaginitis and vulvovaginitis     Discharge Instructions      We will notify you of any your abnormal lab results.     ED Prescriptions     Medication Sig Dispense Auth. Provider   metroNIDAZOLE (FLAGYL) 500 MG tablet Take 1 tablet (500 mg total) by mouth 2 (two) times daily. 14 tablet Bing Neighbors, FNP   fluconazole (DIFLUCAN) 150 MG tablet Take 1 tablet (150 mg total) by mouth daily. 2 tablet Bing Neighbors, FNP   hydrocortisone cream 1 % Apply to affected area 2 times daily 30 g Bing Neighbors, FNP   famotidine (PEPCID) 40 MG tablet Take 2 tablets (80 mg total) by mouth at bedtime for 14 days. 28 tablet Bing Neighbors, FNP      PDMP not reviewed this encounter.   Bing Neighbors, FNP 06/16/21 [phone]

## 2021-06-14 NOTE — Discharge Instructions (Addendum)
We will notify you of any your abnormal lab results.

## 2021-06-16 LAB — CYTOLOGY, (ORAL, ANAL, URETHRAL) ANCILLARY ONLY
Chlamydia: NEGATIVE
Comment: NEGATIVE
Comment: NORMAL
Neisseria Gonorrhea: NEGATIVE

## 2021-06-16 LAB — CERVICOVAGINAL ANCILLARY ONLY
Bacterial Vaginitis (gardnerella): NEGATIVE
Candida Glabrata: NEGATIVE
Candida Vaginitis: POSITIVE — AB
Chlamydia: NEGATIVE
Comment: NEGATIVE
Comment: NEGATIVE
Comment: NEGATIVE
Comment: NEGATIVE
Comment: NEGATIVE
Comment: NORMAL
Neisseria Gonorrhea: NEGATIVE
Trichomonas: NEGATIVE

## 2021-06-18 LAB — HSV CULTURE AND TYPING

## 2022-04-06 ENCOUNTER — Encounter (HOSPITAL_COMMUNITY): Payer: Self-pay | Admitting: Licensed Clinical Social Worker

## 2022-04-06 ENCOUNTER — Ambulatory Visit (INDEPENDENT_AMBULATORY_CARE_PROVIDER_SITE_OTHER): Payer: Medicaid Other | Admitting: Licensed Clinical Social Worker

## 2022-04-06 DIAGNOSIS — F1210 Cannabis abuse, uncomplicated: Secondary | ICD-10-CM | POA: Diagnosis not present
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
## 2023-10-27 ENCOUNTER — Telehealth: Payer: Self-pay

## 2023-10-27 ENCOUNTER — Ambulatory Visit
Admission: EM | Admit: 2023-10-27 | Discharge: 2023-10-27 | Disposition: A | Discharge status: Home / Self Care | Payer: MEDICAID | Attending: Internal Medicine | Admitting: Internal Medicine

## 2023-10-27 DIAGNOSIS — N898 Other specified noninflammatory disorders of vagina: Secondary | ICD-10-CM

## 2023-10-27 DIAGNOSIS — L0201 Cutaneous abscess of face: Secondary | ICD-10-CM

## 2023-10-27 DIAGNOSIS — Z113 Encounter for screening for infections with a predominantly sexual mode of transmission: Secondary | ICD-10-CM

## 2023-10-27 MED ORDER — DOXYCYCLINE HYCLATE 100 MG PO CAPS: 100.0000 mg | ORAL_CAPSULE | Freq: Two times a day (BID) | ORAL | 0 refills | Status: DC

## 2023-10-27 MED ORDER — NAPROXEN 500 MG PO TABS: 500.0000 mg | ORAL_TABLET | Freq: Two times a day (BID) | ORAL | 0 refills | Status: DC

## 2023-10-27 NOTE — ED Provider Notes (Signed)
[facility]  Note:  This document was prepared using Conservation officer, historic buildings and may include unintentional dictation errors.  MRN: [id number] DOB: [birth year]  Subjective:   Kelly [name] is a 22 y.o. female presenting for 3-day history of acute onset persistent worsening swelling about the right medial eyebrow.  Patient felt it was an ingrown hair and tried to squeeze and remove it.  She has previously had boils like this that she was successful at reducing.  However, this was getting worse including redness and pain spreading to the right upper eyelid.  Patient would also like to have sexually-transmitted infection testing.  Has had vaginal discharge.  No concern for pregnancy.  No fever, nausea, vomiting, abdominal or pelvic pain.  No current facility-administered medications for this encounter.  Current Outpatient Medications:    amoxicillin (AMOXIL) 875 MG tablet, Take 1 tablet (875 mg total) by mouth 2 (two) times daily., Disp: 14 tablet, Rfl: 0   famotidine (PEPCID) 40 MG tablet, Take 2 tablets (80 mg total) by mouth at bedtime for 14 days., Disp: 28 tablet, Rfl: 0   fluconazole (DIFLUCAN) 150 MG tablet, Take 1 tablet (150 mg total) by mouth daily., Disp: 2 tablet, Rfl: 0   hydrocortisone cream 1 %, Apply to affected area 2 times daily, Disp: 30 g, Rfl: 0   ipratropium (ATROVENT) 0.03 % nasal spray, Place 2 sprays into both nostrils 2 (two) times daily as needed for rhinitis., Disp: 30 mL, Rfl: 0   levocetirizine (XYZAL) 5 MG tablet, Take 1 tablet (5 mg total) by mouth every evening., Disp: 30 tablet, Rfl: 0   metroNIDAZOLE (FLAGYL) 500 MG tablet, Take 1 tablet (500 mg total) by mouth 2 (two) times daily., Disp: 14 tablet, Rfl: 0   naproxen (NAPROSYN) 500 MG tablet, Take 1 tablet (500 mg total) by mouth 2 (two) times daily with a meal., Disp: 30 tablet, Rfl: 0   No Known Allergies  Past Medical History:  Diagnosis Date   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   Cannabis abuse 11/12/2015   Chlamydia    Disruptive, impulse control, 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   GERD (gastroesophageal reflux disease)    Gonorrhea    Seizures (HCC)    one seizure August 2016   Vomiting      Past Surgical History:  Procedure Laterality Date   tubes in ears      Family History  Problem Relation Age of Onset   Healthy Mother    Cancer Maternal Uncle     Social History   Tobacco Use   Smoking status: Every Day    Current packs/day: 0.00    Types: Cigarettes, E-cigarettes    Last attempt to quit: 05/25/2016    Years since quitting: 7.4   Smokeless tobacco: Never  Vaping Use   Vaping status: Never Used  Substance Use Topics   Alcohol use: No   Drug use: Yes    Types: Marijuana    Comment: Occassionally    ROS   Objective:   Vitals: BP 99/65 (BP Location: Right Arm)   Pulse (!) 104   Temp 98.1 F (36.7 C) (Oral)   Resp 18   Ht 5\' 6"  (1.676 m)   Wt 130 lb (59 kg)   LMP  (LMP Unknown) Comment: I haven't had a period in a long time.  SpO2 99%   BMI 20.98 kg/m   Physical Exam Constitutional:  General: She is not in acute distress.    Appearance: Normal appearance. She is well-developed. She is not ill-appearing, toxic-appearing or diaphoretic.  HENT:     Head: Normocephalic and atraumatic.      Nose: Nose normal.     Mouth/Throat:     Mouth: Mucous membranes are moist.  Eyes:     General: No scleral icterus.       Right eye: No discharge.        Left eye: No discharge.     Extraocular Movements: Extraocular movements intact.  Cardiovascular:     Rate and Rhythm: Normal rate.  Pulmonary:     Effort: Pulmonary effort is normal.  Skin:    General: Skin is warm and dry.  Neurological:     General: No focal deficit present.     Mental Status: She is alert and oriented to person, place, and time.  Psychiatric:        Mood and Affect: Mood normal.        Behavior:  Behavior normal.    PROCEDURE NOTE: I&D of Abscess Verbal consent obtained. Local anesthesia with 2cc of 2% lidocaine with epinephrine. Site cleansed with Betadine and the alcohol swabs. Incision of 1/4cm was made using an 11 blade, 1cc expressed consisting of a mixture of pus and serosanguinous fluid. Wound cavity was explored with curved hemostats and loculations loosened. Cleansed and dressed.  Assessment and Plan :   PDMP not reviewed this encounter.  1. Facial abscess   2. Screening examination for STI   3. Vaginal discharge    STI check pending, will treat as appropriate based off of results.  Successful I&D performed.  Wound care reviewed.  Start doxycycline for the abscess, naproxen for pain and inflammation. Counseled patient on potential for adverse effects with medications prescribed/recommended today, ER and return-to-clinic precautions discussed, patient verbalized understanding.    Wallis Bamberg, PA-C 10/27/23 1850

## 2023-10-27 NOTE — Discharge Instructions (Signed)
Please change your dressing 3-5 times daily. Do not apply any ointments or creams. Each time you change your dressing, make sure that you are pressing on the wound to get pus to come out.  Try your best to clean the wound with antibacterial soap and warm water. Pat your wound dry and let it air out if possible to make sure it is dry before reapplying another dressing.   Start doxycycline for the infection. Use naproxen for the pain.

## 2023-10-27 NOTE — ED Triage Notes (Signed)
"  This started just after having my eyebrows done, I noticed an ingrown hair in my right eye brow I got tweezers and removed it and popped the bumb". The next day it got bigger and bigger. "The swelling now is around my eye with redness".   "I also want STI testing (swab only)". Current symptoms: "possible BV but not sure". No dysuria.

## 2023-10-29 LAB — CERVICOVAGINAL ANCILLARY ONLY
Bacterial Vaginitis (gardnerella): POSITIVE — AB
Candida Glabrata: POSITIVE — AB
Candida Vaginitis: NEGATIVE
Chlamydia: NEGATIVE
Comment: NEGATIVE
Comment: NEGATIVE
Comment: NEGATIVE
Comment: NEGATIVE
Comment: NEGATIVE
Comment: NORMAL
Neisseria Gonorrhea: NEGATIVE
Trichomonas: NEGATIVE

## 2023-11-01 ENCOUNTER — Telehealth (HOSPITAL_COMMUNITY): Payer: Self-pay

## 2023-11-01 MED ORDER — METRONIDAZOLE 500 MG PO TABS: 500.0000 mg | ORAL_TABLET | Freq: Two times a day (BID) | ORAL | 0 refills | Status: AC

## 2023-11-01 NOTE — Telephone Encounter (Signed)
Per protocol, pt requires tx with metronidazole. Rx sent to pharmacy on file.

## 2024-05-22 ENCOUNTER — Emergency Department (HOSPITAL_COMMUNITY)
Admission: EM | Admit: 2024-05-22 | Discharge: 2024-05-22 | Disposition: A | Discharge status: Home / Self Care | Payer: MEDICAID

## 2024-05-22 ENCOUNTER — Other Ambulatory Visit: Payer: Self-pay

## 2024-05-22 ENCOUNTER — Encounter (HOSPITAL_COMMUNITY): Payer: Self-pay | Admitting: Emergency Medicine

## 2024-05-22 ENCOUNTER — Emergency Department (HOSPITAL_COMMUNITY): Payer: MEDICAID

## 2024-05-22 DIAGNOSIS — Y92410 Unspecified street and highway as the place of occurrence of the external cause: Secondary | ICD-10-CM | POA: Diagnosis not present

## 2024-05-22 DIAGNOSIS — S82842A Displaced bimalleolar fracture of left lower leg, initial encounter for closed fracture: Secondary | ICD-10-CM | POA: Insufficient documentation

## 2024-05-22 DIAGNOSIS — S90512A Abrasion, left ankle, initial encounter: Secondary | ICD-10-CM

## 2024-05-22 DIAGNOSIS — M25572 Pain in left ankle and joints of left foot: Secondary | ICD-10-CM | POA: Diagnosis present

## 2024-05-22 MED ORDER — ACETAMINOPHEN 500 MG PO TABS
1000.0000 mg | ORAL_TABLET | Freq: Once | ORAL | Status: AC
  Administered 2024-05-22: 1000 mg via ORAL
  Filled 2024-05-22: qty 2

## 2024-05-22 MED ORDER — KETOROLAC TROMETHAMINE 15 MG/ML IJ SOLN
15.0000 mg | Freq: Once | INTRAMUSCULAR | Status: DC
  Filled 2024-05-22: qty 1
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
## 2024-05-22 NOTE — Discharge Instructions (Signed)
 You can alternate Tylenol  Motrin  as needed for pain.  Is important that you wear your splint and use your crutches and remain nonweightbearing until you see orthopedic surgery.  Please call the number listed in your paperwork tomorrow morning to make a follow-up appointment.

## 2024-05-22 NOTE — ED Notes (Signed)
 Patient continues to fall asleep while sitting up, will awake to verbal stimuli.

## 2024-05-22 NOTE — Progress Notes (Signed)
 Orthopedic Tech Progress Note Patient Details:  Kelly [name] 27-Mar-2001 [id number]  Ortho Devices Type of Ortho Device: Crutches, Post (short leg) splint, Stirrup splint Ortho Device/Splint Location: lle Ortho Device/Splint Interventions: Ordered, Application, Adjustment  I applied splint after dr cleaned the wounds. I wrapped the foot with nonstick dressing and guaze. Then I applied the splint. The rn assisted me while I walked the pt with the crutches. Post Interventions Patient Tolerated: Well Instructions Provided: Care of device, Adjustment of device  [name] PARAS 05/22/2024, 10:36 PM
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
## 2024-09-06 ENCOUNTER — Emergency Department (HOSPITAL_COMMUNITY)
Admission: EM | Admit: 2024-09-06 | Discharge: 2024-09-07 | Disposition: A | Discharge status: Other Acute Inpatient Hospital | Payer: MEDICAID | Attending: Emergency Medicine | Admitting: Emergency Medicine

## 2024-09-06 DIAGNOSIS — T1491XD Suicide attempt, subsequent encounter: Secondary | ICD-10-CM | POA: Diagnosis not present

## 2024-09-06 DIAGNOSIS — F913 Oppositional defiant disorder: Secondary | ICD-10-CM | POA: Insufficient documentation

## 2024-09-06 DIAGNOSIS — F1113 Opioid abuse with withdrawal: Secondary | ICD-10-CM | POA: Diagnosis present

## 2024-09-06 DIAGNOSIS — R45851 Suicidal ideations: Secondary | ICD-10-CM | POA: Diagnosis not present

## 2024-09-06 DIAGNOSIS — F32A Depression, unspecified: Secondary | ICD-10-CM | POA: Diagnosis present

## 2024-09-06 DIAGNOSIS — R4182 Altered mental status, unspecified: Secondary | ICD-10-CM | POA: Diagnosis not present

## 2024-09-06 DIAGNOSIS — F1420 Cocaine dependence, uncomplicated: Secondary | ICD-10-CM

## 2024-09-06 DIAGNOSIS — F329 Major depressive disorder, single episode, unspecified: Secondary | ICD-10-CM | POA: Insufficient documentation

## 2024-09-06 DIAGNOSIS — S8290XG Unspecified fracture of unspecified lower leg, subsequent encounter for closed fracture with delayed healing: Secondary | ICD-10-CM | POA: Diagnosis not present

## 2024-09-06 DIAGNOSIS — F1120 Opioid dependence, uncomplicated: Secondary | ICD-10-CM | POA: Diagnosis not present

## 2024-09-06 DIAGNOSIS — X810XXD Intentional self-harm by jumping or lying in front of motor vehicle, subsequent encounter: Secondary | ICD-10-CM | POA: Diagnosis not present

## 2024-09-06 DIAGNOSIS — F1910 Other psychoactive substance abuse, uncomplicated: Secondary | ICD-10-CM | POA: Diagnosis present

## 2024-09-06 DIAGNOSIS — R9431 Abnormal electrocardiogram [ECG] [EKG]: Secondary | ICD-10-CM | POA: Diagnosis not present

## 2024-09-06 DIAGNOSIS — F1490 Cocaine use, unspecified, uncomplicated: Secondary | ICD-10-CM | POA: Diagnosis not present

## 2024-09-06 DIAGNOSIS — Z5901 Sheltered homelessness: Secondary | ICD-10-CM | POA: Diagnosis not present

## 2024-09-06 LAB — COMPREHENSIVE METABOLIC PANEL WITH GFR
ALT: 14 U/L (ref 0–44)
AST: 21 U/L (ref 15–41)
Albumin: 4 g/dL (ref 3.5–5.0)
Alkaline Phosphatase: 91 U/L (ref 38–126)
Anion gap: 9 (ref 5–15)
BUN: 9 mg/dL (ref 6–20)
CO2: 26 mmol/L (ref 22–32)
Calcium: 9.4 mg/dL (ref 8.9–10.3)
Chloride: 104 mmol/L (ref 98–111)
Creatinine, Ser: 0.51 mg/dL (ref 0.44–1.00)
GFR, Estimated: >60 mL/min (ref >60)
Glucose, Bld: 98 mg/dL (ref 70–99)
Potassium: 3.7 mmol/L (ref 3.5–5.1)
Sodium: 138 mmol/L (ref 135–145)
Total Bilirubin: 0.6 mg/dL (ref 0.0–1.2)
Total Protein: 7.1 g/dL (ref 6.5–8.1)

## 2024-09-06 LAB — CBC WITH DIFFERENTIAL/PLATELET
Abs Immature Granulocytes: 0.03 K/uL (ref 0.00–0.07)
Basophils Absolute: 0.1 K/uL (ref 0.0–0.1)
Basophils Relative: 0 %
Eosinophils Absolute: 0.3 K/uL (ref 0.0–0.5)
Eosinophils Relative: 3 %
HCT: 42.2 % (ref 36.0–46.0)
Hemoglobin: 13.9 g/dL (ref 12.0–15.0)
Immature Granulocytes: 0 %
Lymphocytes Relative: 16 %
Lymphs Abs: 1.9 K/uL (ref 0.7–4.0)
MCH: 28.8 pg (ref 26.0–34.0)
MCHC: 32.9 g/dL (ref 30.0–36.0)
MCV: 87.6 fL (ref 80.0–100.0)
Monocytes Absolute: 1 K/uL (ref 0.1–1.0)
Monocytes Relative: 9 %
Neutro Abs: 8.5 K/uL — ABNORMAL HIGH (ref 1.7–7.7)
Neutrophils Relative %: 72 %
Platelets: 279 K/uL (ref 150–400)
RBC: 4.82 MIL/uL (ref 3.87–5.11)
RDW: 12.9 % (ref 11.5–15.5)
WBC: 11.8 K/uL — ABNORMAL HIGH (ref 4.0–10.5)
nRBC: 0 % (ref 0.0–0.2)

## 2024-09-06 LAB — HCG, SERUM, QUALITATIVE: Preg, Serum: NEGATIVE

## 2024-09-06 LAB — ETHANOL: Alcohol, Ethyl (B): <15 mg/dL (ref <15)

## 2024-09-06 NOTE — ED Notes (Signed)
 Mom states pt will take any drug she can get her hands on and she believes she is suicidal.

## 2024-09-06 NOTE — ED Notes (Addendum)
 Pt currently refusing to allow staff to get EKG. Multiple attempts made. Also unable to provide a urine specimen at this time speci-hat placed in commode.

## 2024-09-06 NOTE — ED Triage Notes (Signed)
 Per [employer], called to the magistrate's office after being IVC'd by sister. IVC states pt has mental illness and is a danger to self. Hx of drug use.

## 2024-09-06 NOTE — BH Assessment (Signed)
 TTS Consult will be completed by IRIS. IRIS Coordinator will provide assessment time and provider name. Thanks

## 2024-09-07 ENCOUNTER — Other Ambulatory Visit (HOSPITAL_COMMUNITY)
Admission: EM | Admit: 2024-09-07 | Discharge: 2024-09-08 | Disposition: A | Discharge status: Other Acute Inpatient Hospital | Payer: MEDICAID | Source: Intra-hospital | Attending: Psychiatry | Admitting: Psychiatry

## 2024-09-07 ENCOUNTER — Encounter (HOSPITAL_COMMUNITY): Payer: Self-pay | Admitting: Psychiatric/Mental Health

## 2024-09-07 DIAGNOSIS — T1491XD Suicide attempt, subsequent encounter: Secondary | ICD-10-CM | POA: Insufficient documentation

## 2024-09-07 DIAGNOSIS — F1490 Cocaine use, unspecified, uncomplicated: Secondary | ICD-10-CM | POA: Insufficient documentation

## 2024-09-07 DIAGNOSIS — X810XXD Intentional self-harm by jumping or lying in front of motor vehicle, subsequent encounter: Secondary | ICD-10-CM | POA: Insufficient documentation

## 2024-09-07 DIAGNOSIS — S8290XG Unspecified fracture of unspecified lower leg, subsequent encounter for closed fracture with delayed healing: Secondary | ICD-10-CM | POA: Insufficient documentation

## 2024-09-07 DIAGNOSIS — F1420 Cocaine dependence, uncomplicated: Secondary | ICD-10-CM

## 2024-09-07 DIAGNOSIS — F1113 Opioid abuse with withdrawal: Secondary | ICD-10-CM | POA: Insufficient documentation

## 2024-09-07 DIAGNOSIS — R9431 Abnormal electrocardiogram [ECG] [EKG]: Secondary | ICD-10-CM | POA: Insufficient documentation

## 2024-09-07 DIAGNOSIS — F32A Depression, unspecified: Secondary | ICD-10-CM | POA: Insufficient documentation

## 2024-09-07 DIAGNOSIS — F1910 Other psychoactive substance abuse, uncomplicated: Secondary | ICD-10-CM

## 2024-09-07 DIAGNOSIS — F1120 Opioid dependence, uncomplicated: Secondary | ICD-10-CM

## 2024-09-07 DIAGNOSIS — F1110 Opioid abuse, uncomplicated: Secondary | ICD-10-CM

## 2024-09-07 DIAGNOSIS — Z5901 Sheltered homelessness: Secondary | ICD-10-CM | POA: Insufficient documentation

## 2024-09-07 DIAGNOSIS — R4182 Altered mental status, unspecified: Secondary | ICD-10-CM | POA: Insufficient documentation

## 2024-09-07 DIAGNOSIS — F329 Major depressive disorder, single episode, unspecified: Secondary | ICD-10-CM

## 2024-09-07 DIAGNOSIS — Z046 Encounter for general psychiatric examination, requested by authority: Secondary | ICD-10-CM

## 2024-09-07 DIAGNOSIS — F913 Oppositional defiant disorder: Secondary | ICD-10-CM

## 2024-09-07 LAB — URINE DRUG SCREEN
Amphetamines: NEGATIVE
Barbiturates: NEGATIVE
Benzodiazepines: NEGATIVE
Cocaine: POSITIVE — AB
Fentanyl: POSITIVE — AB
Methadone Scn, Ur: NEGATIVE
Opiates: NEGATIVE
Tetrahydrocannabinol: NEGATIVE

## 2024-09-07 MED ORDER — ACETAMINOPHEN 325 MG PO TABS: 650.0000 mg | ORAL_TABLET | Freq: Four times a day (QID) | ORAL | Status: DC | PRN

## 2024-09-07 MED ORDER — CLONIDINE HCL 0.1 MG PO TABS: 0.1000 mg | ORAL_TABLET | Freq: Four times a day (QID) | ORAL | Status: DC

## 2024-09-07 MED ORDER — NAPROXEN 500 MG PO TABS
500.0000 mg | ORAL_TABLET | Freq: Two times a day (BID) | ORAL | Status: DC | PRN
  Administered 2024-09-07: 500 mg via ORAL
  Filled 2024-09-07: qty 1

## 2024-09-07 MED ORDER — TRAZODONE HCL 50 MG PO TABS
50.0000 mg | ORAL_TABLET | Freq: Every evening | ORAL | Status: DC | PRN
  Filled 2024-09-07: qty 1

## 2024-09-07 MED ORDER — HYDROXYZINE HCL 25 MG PO TABS
25.0000 mg | ORAL_TABLET | Freq: Three times a day (TID) | ORAL | Status: DC | PRN
  Administered 2024-09-07: 25 mg via ORAL
  Filled 2024-09-07: qty 1

## 2024-09-07 MED ORDER — NAPROXEN 500 MG PO TABS
500.0000 mg | ORAL_TABLET | Freq: Two times a day (BID) | ORAL | Status: DC | PRN
  Administered 2024-09-07 – 2024-09-08 (×3): 500 mg via ORAL
  Filled 2024-09-07 (×3): qty 1

## 2024-09-07 MED ORDER — ONDANSETRON 4 MG PO TBDP: 4.0000 mg | ORAL_TABLET | Freq: Four times a day (QID) | ORAL | Status: DC | PRN

## 2024-09-07 MED ORDER — LOPERAMIDE HCL 2 MG PO CAPS: 2.0000 mg | ORAL_CAPSULE | ORAL | Status: DC | PRN

## 2024-09-07 MED ORDER — METHOCARBAMOL 500 MG PO TABS
500.0000 mg | ORAL_TABLET | Freq: Three times a day (TID) | ORAL | Status: DC | PRN
  Administered 2024-09-07: 500 mg via ORAL
  Filled 2024-09-07: qty 1

## 2024-09-07 MED ORDER — CLONIDINE HCL 0.1 MG PO TABS: 0.1000 mg | ORAL_TABLET | Freq: Every day | ORAL | Status: DC

## 2024-09-07 MED ORDER — LORAZEPAM 2 MG/ML IJ SOLN: 2.0000 mg | Freq: Three times a day (TID) | INTRAMUSCULAR | Status: DC | PRN

## 2024-09-07 MED ORDER — TRAZODONE HCL 50 MG PO TABS: 50.0000 mg | ORAL_TABLET | Freq: Every evening | ORAL | Status: DC | PRN

## 2024-09-07 MED ORDER — HALOPERIDOL LACTATE 5 MG/ML IJ SOLN: 5.0000 mg | Freq: Three times a day (TID) | INTRAMUSCULAR | Status: DC | PRN

## 2024-09-07 MED ORDER — CLONIDINE HCL 0.1 MG PO TABS: 0.1000 mg | ORAL_TABLET | ORAL | Status: DC

## 2024-09-07 MED ORDER — ONDANSETRON 4 MG PO TBDP
4.0000 mg | ORAL_TABLET | Freq: Once | ORAL | Status: AC
  Administered 2024-09-07: 4 mg via ORAL
  Filled 2024-09-07: qty 1

## 2024-09-07 MED ORDER — ALUM & MAG HYDROXIDE-SIMETH 200-200-20 MG/5ML PO SUSP: 30.0000 mL | ORAL | Status: DC | PRN

## 2024-09-07 MED ORDER — HALOPERIDOL 5 MG PO TABS
5.0000 mg | ORAL_TABLET | Freq: Three times a day (TID) | ORAL | Status: DC | PRN
  Administered 2024-09-07: 5 mg via ORAL
  Filled 2024-09-07 (×2): qty 1

## 2024-09-07 MED ORDER — DICYCLOMINE HCL 20 MG PO TABS: 20.0000 mg | ORAL_TABLET | Freq: Four times a day (QID) | ORAL | Status: DC | PRN

## 2024-09-07 MED ORDER — ONDANSETRON 4 MG PO TBDP
4.0000 mg | ORAL_TABLET | Freq: Four times a day (QID) | ORAL | Status: DC | PRN
  Administered 2024-09-07: 4 mg via ORAL
  Filled 2024-09-07: qty 1

## 2024-09-07 MED ORDER — OLANZAPINE 10 MG IM SOLR: 5.0000 mg | Freq: Once | INTRAMUSCULAR | Status: DC | PRN

## 2024-09-07 MED ORDER — DIPHENHYDRAMINE HCL 50 MG/ML IJ SOLN: 50.0000 mg | Freq: Three times a day (TID) | INTRAMUSCULAR | Status: DC | PRN

## 2024-09-07 MED ORDER — NAPROXEN 500 MG PO TABS: 500.0000 mg | ORAL_TABLET | Freq: Two times a day (BID) | ORAL | Status: DC | PRN

## 2024-09-07 MED ORDER — HYDROXYZINE HCL 25 MG PO TABS
25.0000 mg | ORAL_TABLET | Freq: Three times a day (TID) | ORAL | Status: DC | PRN
  Filled 2024-09-07: qty 1

## 2024-09-07 MED ORDER — MAGNESIUM HYDROXIDE 400 MG/5ML PO SUSP: 30.0000 mL | Freq: Every day | ORAL | Status: DC | PRN

## 2024-09-07 MED ORDER — HALOPERIDOL LACTATE 5 MG/ML IJ SOLN: 10.0000 mg | Freq: Three times a day (TID) | INTRAMUSCULAR | Status: DC | PRN

## 2024-09-07 MED ORDER — CLONIDINE HCL 0.1 MG PO TABS
0.1000 mg | ORAL_TABLET | Freq: Four times a day (QID) | ORAL | Status: DC
  Administered 2024-09-07: 0.1 mg via ORAL
  Filled 2024-09-07: qty 1

## 2024-09-07 MED ORDER — METHOCARBAMOL 500 MG PO TABS: 500.0000 mg | ORAL_TABLET | Freq: Three times a day (TID) | ORAL | Status: DC | PRN

## 2024-09-07 MED ORDER — CLONIDINE HCL 0.1 MG PO TABS
0.1000 mg | ORAL_TABLET | Freq: Four times a day (QID) | ORAL | Status: DC
  Administered 2024-09-07 – 2024-09-08 (×4): 0.1 mg via ORAL
  Filled 2024-09-07 (×4): qty 1

## 2024-09-07 MED ORDER — DIPHENHYDRAMINE HCL 50 MG PO CAPS
50.0000 mg | ORAL_CAPSULE | Freq: Three times a day (TID) | ORAL | Status: DC | PRN
  Administered 2024-09-07: 50 mg via ORAL
  Filled 2024-09-07 (×2): qty 1

## 2024-09-07 MED ORDER — DICYCLOMINE HCL 20 MG PO TABS
20.0000 mg | ORAL_TABLET | Freq: Four times a day (QID) | ORAL | Status: DC | PRN
  Administered 2024-09-07 – 2024-09-08 (×2): 20 mg via ORAL
  Filled 2024-09-07 (×2): qty 1

## 2024-09-07 MED ORDER — METHOCARBAMOL 500 MG PO TABS
500.0000 mg | ORAL_TABLET | Freq: Three times a day (TID) | ORAL | Status: DC | PRN
  Administered 2024-09-07 – 2024-09-08 (×2): 500 mg via ORAL
  Filled 2024-09-07 (×3): qty 1

## 2024-09-07 MED ORDER — HYDROXYZINE HCL 25 MG PO TABS
25.0000 mg | ORAL_TABLET | Freq: Four times a day (QID) | ORAL | Status: DC | PRN
  Administered 2024-09-07: 25 mg via ORAL
  Filled 2024-09-07: qty 1

## 2024-09-07 NOTE — ED Notes (Signed)
 Patient is in the bedroom composed and sleeping atm, NAD. Respirations even and unlabored. Will continue to monitor for safety.

## 2024-09-07 NOTE — Progress Notes (Addendum)
 Pt has been accepted to [facility] on 09/07/2024 Bed assignment: 172  Pt meets inpatient criteria per: Katlin Haunt NP  Attending Physician will be: Kandi Hahn MD  Report can be called to: [phone]   Pt can arrive NOW  Care Team Notified: [facility] Bretta Qua RN, Chesley Holt [facility], Beth Willis RN  [place] Fajr Fife LCSW-A   09/07/2024 10:50 AM

## 2024-09-07 NOTE — ED Notes (Signed)
 [facility] called pts sister, Alanna to inform her that pt is being transferred to [facility]. [facility] provided contact information for [facility] and encouraged her to work with the Child psychotherapist and pt to encourage her to continue treatment in a 28 day residential treatment facility when she is discharged as well as outpatient treatment/therapy. Pts sister was appreciative of the information.  Chesley Holt, [facility]  09/07/24

## 2024-09-07 NOTE — ED Notes (Signed)
 Patient off unit to [facility] per provider. Patient alert and cooperative. Discharge information and belongings given to [employer] for transport. Patient ambulatory. Patient off unit using w/c . Patient escorted and transported by [employer].
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
## 2024-09-07 NOTE — ED Notes (Signed)
 Patient is in the bedroom calm and composed. NAD. Respirations even and unlabored.  Environment secured per policy Will monitor for safety.

## 2024-09-07 NOTE — Group Note (Signed)
 Group Topic: Social Support  Group Date: 09/07/2024 Start Time: 2000 End Time: 2100 Facilitators: Joan Plowman B  Department: [facility]  Number of Participants: 5  Group Focus: concentration, coping skills, and daily focus Treatment Modality:  Individual Therapy Interventions utilized were leisure development, patient education, and support Purpose: enhance coping skills, express feelings, and relapse prevention strategies  Name: Kelly [name] Date of Birth: 11/06/2001  MR: [id number]    Level of Participation:  PT WOULD NOT GO TO GROUPS Quality of Participation: attention seeking, disruptive, distracting to others, isolative, pushed limits, uncooperative, and withdrawn Interactions with others: intrusive Mood/Affect: irritable Triggers (if applicable): NA Cognition: no insight, not focused, processing slowly, and tracking difficulty Progress: None Response: Pt is in her room screaming and hitting the walls. She refuses to come out of her room for anything.  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

## 2024-09-07 NOTE — ED Notes (Signed)
 IVC status.  Pt transferred from [facility] to [facility] bed 163. Pt states, how long do I have to be here?   Pt is irritable and guarded during admission assessment.  Pt reports she is withdrawing from Fentanyl , she has not used x 2 days.    Denied SI/HI and A/V hallucinations Pt does not want long term treatment Q 15 minute observations for safety continue

## 2024-09-07 NOTE — Progress Notes (Signed)
 Patient is accepted to [facility] by Katlin Hunt Np. Dr Cole is the attending.Fax or upload a copy of the First Exam. Patient's nurse is aware of the request.

## 2024-09-07 NOTE — ED Notes (Signed)
 Pt note to be in withdrawals. Unable to keep still,  reports nausea and GI upset,  sniffling, anxiety hot and cold sensations.  COWs 10
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
## 2024-09-07 NOTE — ED Notes (Signed)
 Pt is in the room hitting the walls screaming.
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
## 2024-09-07 NOTE — ED Provider Notes (Signed)
 Patient has been accepted at behavioral health urgent care, accepting physician is Dr Cole.   Raford Lenis, MD 09/07/24 [phone]

## 2024-09-07 NOTE — Care Management (Signed)
 [facility] Care Management  Writer met with the patient and discussed discharge planning.  Patient requests outpatient substance abuse treatment.    Patient reports that she has been living with her sister and she would prefer outpatient treatment.  Patient does not appear to be motivated for treatment.   Patient was IVC'd by her sister.  Per documentation in epic, Her sister placed her under IVC, reporting that since her father died a year ago, she has become more depressed and engaging in dangerous behaviors. Reports that she has begun use using and abusing fentanyl  and crack cocaine.    Writer will consult with the MD regarding the patients disposition.
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
## 2024-09-08 ENCOUNTER — Ambulatory Visit (HOSPITAL_COMMUNITY)
Admission: EM | Admit: 2024-09-08 | Discharge: 2024-09-08 | Disposition: A | Discharge status: Home / Self Care | Payer: MEDICAID

## 2024-09-08 ENCOUNTER — Emergency Department (HOSPITAL_COMMUNITY): Payer: MEDICAID

## 2024-09-08 ENCOUNTER — Other Ambulatory Visit: Payer: Self-pay

## 2024-09-08 ENCOUNTER — Other Ambulatory Visit (HOSPITAL_COMMUNITY)
Admission: EM | Admit: 2024-09-08 | Discharge: 2024-09-09 | Disposition: A | Discharge status: Other Acute Inpatient Hospital | Payer: MEDICAID | Attending: Psychiatry | Admitting: Psychiatry

## 2024-09-08 ENCOUNTER — Emergency Department (HOSPITAL_COMMUNITY)
Admission: EM | Admit: 2024-09-08 | Discharge: 2024-09-08 | Disposition: A | Discharge status: Other Acute Inpatient Hospital | Payer: MEDICAID | Attending: Emergency Medicine | Admitting: Emergency Medicine

## 2024-09-08 DIAGNOSIS — Z5902 Unsheltered homelessness: Secondary | ICD-10-CM | POA: Insufficient documentation

## 2024-09-08 DIAGNOSIS — R4789 Other speech disturbances: Secondary | ICD-10-CM | POA: Diagnosis not present

## 2024-09-08 DIAGNOSIS — F1910 Other psychoactive substance abuse, uncomplicated: Secondary | ICD-10-CM | POA: Diagnosis present

## 2024-09-08 DIAGNOSIS — F329 Major depressive disorder, single episode, unspecified: Secondary | ICD-10-CM | POA: Diagnosis not present

## 2024-09-08 DIAGNOSIS — D72829 Elevated white blood cell count, unspecified: Secondary | ICD-10-CM | POA: Insufficient documentation

## 2024-09-08 DIAGNOSIS — F29 Unspecified psychosis not due to a substance or known physiological condition: Secondary | ICD-10-CM | POA: Insufficient documentation

## 2024-09-08 DIAGNOSIS — F1994 Other psychoactive substance use, unspecified with psychoactive substance-induced mood disorder: Secondary | ICD-10-CM | POA: Insufficient documentation

## 2024-09-08 DIAGNOSIS — R4589 Other symptoms and signs involving emotional state: Secondary | ICD-10-CM | POA: Insufficient documentation

## 2024-09-08 DIAGNOSIS — E860 Dehydration: Secondary | ICD-10-CM | POA: Diagnosis not present

## 2024-09-08 DIAGNOSIS — F1120 Opioid dependence, uncomplicated: Secondary | ICD-10-CM | POA: Insufficient documentation

## 2024-09-08 DIAGNOSIS — F1510 Other stimulant abuse, uncomplicated: Secondary | ICD-10-CM | POA: Insufficient documentation

## 2024-09-08 DIAGNOSIS — Z0279 Encounter for issue of other medical certificate: Secondary | ICD-10-CM | POA: Diagnosis present

## 2024-09-08 DIAGNOSIS — F913 Oppositional defiant disorder: Secondary | ICD-10-CM | POA: Diagnosis not present

## 2024-09-08 DIAGNOSIS — R45851 Suicidal ideations: Secondary | ICD-10-CM | POA: Diagnosis not present

## 2024-09-08 LAB — BASIC METABOLIC PANEL WITH GFR
Anion gap: 16 — ABNORMAL HIGH (ref 5–15)
BUN: 14 mg/dL (ref 6–20)
CO2: 26 mmol/L (ref 22–32)
Calcium: 10.9 mg/dL — ABNORMAL HIGH (ref 8.9–10.3)
Chloride: 99 mmol/L (ref 98–111)
Creatinine, Ser: 0.6 mg/dL (ref 0.44–1.00)
GFR, Estimated: >60 mL/min (ref >60)
Glucose, Bld: 120 mg/dL — ABNORMAL HIGH (ref 70–99)
Potassium: 3.2 mmol/L — ABNORMAL LOW (ref 3.5–5.1)
Sodium: 141 mmol/L (ref 135–145)

## 2024-09-08 LAB — CBC WITH DIFFERENTIAL/PLATELET
Abs Immature Granulocytes: 0.06 K/uL (ref 0.00–0.07)
Basophils Absolute: 0 K/uL (ref 0.0–0.1)
Basophils Relative: 0 %
Eosinophils Absolute: 0 K/uL (ref 0.0–0.5)
Eosinophils Relative: 0 %
HCT: 49.2 % — ABNORMAL HIGH (ref 36.0–46.0)
Hemoglobin: 16.2 g/dL — ABNORMAL HIGH (ref 12.0–15.0)
Immature Granulocytes: 0 %
Lymphocytes Relative: 7 %
Lymphs Abs: 1 K/uL (ref 0.7–4.0)
MCH: 28 pg (ref 26.0–34.0)
MCHC: 32.9 g/dL (ref 30.0–36.0)
MCV: 85 fL (ref 80.0–100.0)
Monocytes Absolute: 0.9 K/uL (ref 0.1–1.0)
Monocytes Relative: 6 %
Neutro Abs: 13.5 K/uL — ABNORMAL HIGH (ref 1.7–7.7)
Neutrophils Relative %: 87 %
Platelets: 376 K/uL (ref 150–400)
RBC: 5.79 MIL/uL — ABNORMAL HIGH (ref 3.87–5.11)
RDW: 12.6 % (ref 11.5–15.5)
WBC: 15.5 K/uL — ABNORMAL HIGH (ref 4.0–10.5)
nRBC: 0 % (ref 0.0–0.2)

## 2024-09-08 LAB — CK: Total CK: 717 U/L — ABNORMAL HIGH (ref 38–234)

## 2024-09-08 LAB — SALICYLATE LEVEL: Salicylate Lvl: <7 mg/dL — ABNORMAL LOW (ref 7.0–30.0)

## 2024-09-08 LAB — C-REACTIVE PROTEIN: CRP: 0.6 mg/dL (ref <1.0)

## 2024-09-08 LAB — TROPONIN T, HIGH SENSITIVITY
Troponin T High Sensitivity: <15 ng/L (ref 0–19)
Troponin T High Sensitivity: <15 ng/L (ref 0–19)

## 2024-09-08 LAB — ACETAMINOPHEN LEVEL: Acetaminophen (Tylenol), Serum: <10 ug/mL — ABNORMAL LOW (ref 10–30)

## 2024-09-08 MED ORDER — SODIUM CHLORIDE 0.9 % IV BOLUS
1000.0000 mL | Freq: Once | INTRAVENOUS | Status: AC
  Administered 2024-09-08: 1000 mL via INTRAVENOUS

## 2024-09-08 MED ORDER — ONDANSETRON 4 MG PO TBDP
4.0000 mg | ORAL_TABLET | Freq: Once | ORAL | Status: AC
  Administered 2024-09-08: 4 mg via ORAL
  Filled 2024-09-08: qty 1

## 2024-09-08 NOTE — ED Notes (Signed)
 Paitent offered breakfast. Paitent declined.

## 2024-09-08 NOTE — Discharge Instructions (Addendum)
 Today you were seen for mild dehydration.  Please maintain adequate oral hydration.  Thank you for letting us  treat you today. After reviewing your labs and imaging, I feel you are safe to go home. Please follow up with your PCP in the next several days and provide them with your records from this visit. Return to the Emergency Room if pain becomes severe or symptoms worsen.

## 2024-09-08 NOTE — ED Notes (Signed)
 Paitent did not attend group

## 2024-09-08 NOTE — ED Notes (Signed)
 Paitent agreed to breakfast.

## 2024-09-08 NOTE — ED Notes (Signed)
 Pt provided with meal tray per request.

## 2024-09-08 NOTE — ED Notes (Signed)
 Patient is in the bedroom restless and screaming and banging on the walls. When approached her, she does not seem to be aware of her environment. COWS assessed and pt medicated accordingly. Will monitor for safety.

## 2024-09-08 NOTE — ED Provider Notes (Signed)
 Patient being sent to [facility] due to repeat abnormal EKG in context of recent cocaine use superimposed on chronic opioid use disorder. Patient actively withdrawing from unknown opioids (UDS negative) yet notably hypoactive aside from short bursts of agitation. Patient struggles to maintain sensorium suggestive of hypoactive delirium. Concomitant substance intoxication and withdrawal possible.  Cocaine confounds the cardiac/EKG concerns. Female variant AMI symptoms fatigue, general discomfort, back pain, indigestion, nausea, and piloerection overlap with polysubstance withdrawal. Patient had no response to supportive PRNs for opioid withdrawal.   STAT labs ordered for CK/MB, TropT, hsCRP but not drawn prior to patient transfer to ED.

## 2024-09-08 NOTE — ED Notes (Signed)
Patient continues to be restless

## 2024-09-08 NOTE — ED Notes (Signed)
 Patient banging on walls stating she needs help, checked on patient who said she wanted meds, the nurse had just spoke with her about her meds. Informed patient will speak with the nurse again.

## 2024-09-08 NOTE — ED Notes (Signed)
 Pt has removed her shirt. Pt was informed that she must wear a shirt.   New paper scrub shirt applied.

## 2024-09-08 NOTE — ED Notes (Signed)
 PT appeared to be asleep when she suddenly sat up and shouted  Is anyone going to help me, I've been asking for help and no one is doing anything.

## 2024-09-08 NOTE — ED Notes (Signed)
 Back to [facility].

## 2024-09-08 NOTE — ED Notes (Signed)
Pt provided with drink per request

## 2024-09-08 NOTE — ED Notes (Signed)
 MHTs and RN attempted to obtain EKG on several attempts thus far, pt uncooperative. Mht reports pt will not lay still, or respond to verbal requests. Pt currently laying on side in bed. Provider was made aware.

## 2024-09-08 NOTE — ED Notes (Signed)
Patient refuses the EKG.

## 2024-09-08 NOTE — Discharge Instructions (Addendum)
 Transfer to [facility]
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
## 2024-09-08 NOTE — ED Provider Notes (Signed)
  EMERGENCY DEPARTMENT AT [facility] Provider Note   CSN: [id number] Arrival date & time: 09/08/24  1154     Patient presents with: Medical Clearance   Kelly [name] is a 23 y.o. female presents today from [facility] under IVC for abnormal EKG and concern for cardiac pathology.  Patient somnolent, likely withdrawing from opioids along with other substances.  Patient does have a history of cocaine use.  Patient refusing to participate in HPI or PE.   HPI     Prior to Admission medications   Medication Sig Start Date End Date Taking? Authorizing Provider  ipratropium (ATROVENT ) 0.03 % nasal spray Place 2 sprays into both nostrils 2 (two) times daily as needed for rhinitis. Patient not taking: Reported on 09/06/2024 04/18/21   Arloa Suzen RAMAN, NP    Allergies: Patient has no known allergies.    Review of Systems  Updated Vital Signs BP 135/87 (BP Location: Left Arm)   Pulse 79   Temp 98.4 F (36.9 C) (Oral)   Resp 16   SpO2 100%   Physical Exam Vitals and nursing note reviewed.  Constitutional:      General: She is sleeping. She is not in acute distress.    Appearance: She is well-developed.     Comments: Disheveled appearing  HENT:     Head: Normocephalic and atraumatic.     Right Ear: External ear normal.     Left Ear: External ear normal.     Nose: Nose normal.  Eyes:     Conjunctiva/sclera: Conjunctivae normal.  Cardiovascular:     Rate and Rhythm: Normal rate and regular rhythm.     Pulses: Normal pulses.  Pulmonary:     Effort: Pulmonary effort is normal. No respiratory distress.  Abdominal:     Palpations: Abdomen is soft.     Tenderness: There is no abdominal tenderness.  Musculoskeletal:        General: No swelling.     Cervical back: Neck supple.  Skin:    General: Skin is warm and dry.     Capillary Refill: Capillary refill takes less than 2 seconds.  Psychiatric:        Speech: She is noncommunicative.         Behavior: Behavior is uncooperative.     (all labs ordered are listed, but only abnormal results are displayed) Labs Reviewed  CK - Abnormal; Notable for the following components:      Result Value   Total CK 717 (*)    All other components within normal limits  BASIC METABOLIC PANEL WITH GFR - Abnormal; Notable for the following components:   Potassium 3.2 (*)    Glucose, Bld 120 (*)    Calcium 10.9 (*)    Anion gap 16 (*)    All other components within normal limits  CBC WITH DIFFERENTIAL/PLATELET - Abnormal; Notable for the following components:   WBC 15.5 (*)    RBC 5.79 (*)    Hemoglobin 16.2 (*)    HCT 49.2 (*)    Neutro Abs 13.5 (*)    All other components within normal limits  SALICYLATE LEVEL - Abnormal; Notable for the following components:   Salicylate Lvl <7.0 (*)    All other components within normal limits  ACETAMINOPHEN  LEVEL - Abnormal; Notable for the following components:   Acetaminophen  (Tylenol ), Serum <10 (*)    All other components within normal limits  C-REACTIVE PROTEIN  PREGNANCY, URINE  TROPONIN T, HIGH SENSITIVITY  TROPONIN T, HIGH SENSITIVITY    EKG: EKG Interpretation Date/Time:  Friday September 08 2024 12:51:10 EDT Ventricular Rate:  94 PR Interval:  118 QRS Duration:  86 QT Interval:  341 QTC Calculation: 427 R Axis:   90  Text Interpretation: Sinus rhythm Borderline short PR interval Consider right atrial enlargement Borderline right axis deviation Borderline repolarization abnormality similar to EKG from same day Confirmed by Lenor Hollering [phone]) on 09/08/2024 1:06:23 PM  Radiology: [facility] Chest Portable 1 View Result Date: 09/08/2024 CLINICAL DATA:  Abnormal EKG. EXAM: PORTABLE CHEST 1 VIEW COMPARISON:  February 04, 2005 FINDINGS: The heart size and mediastinal contours are within normal limits. Both lungs are clear. The visualized skeletal structures are unremarkable. IMPRESSION: No active disease. Electronically Signed   By: Suzen Dials M.D.   On: 09/08/2024 13:52     Procedures   Medications Ordered in the ED  sodium chloride  0.9 % bolus 1,000 mL (1,000 mLs Intravenous New Bag/Given 09/08/24 1349)  ondansetron  (ZOFRAN -ODT) disintegrating tablet 4 mg (4 mg Oral Given 09/08/24 1547)                                    Medical Decision Making Amount and/or Complexity of Data Reviewed Labs: ordered. Radiology: ordered.  Risk Prescription drug management.   This patient presents to the ED for concern of medical clearance, this involves an extensive number of treatment options, and is a complaint that carries with it a high risk of complications and morbidity.  The differential diagnosis includes arrhythmia, rhabdomyolysis, electrolyte abnormality   Additional history obtained:  Additional history obtained from EMR External records from outside source obtained and reviewed including behavioral health notes   Lab Tests:  I Ordered, and personally interpreted labs.  The pertinent results include: Leukocytosis of 15.5 with left shift, elevated hemoglobin at 16.2, suspicious for hemoconcentration, CRP 0.6   Imaging Studies ordered:  I ordered imaging studies including CXR  I independently visualized and interpreted imaging which showed no active disease I agree with the radiologist interpretation   Cardiac Monitoring: / EKG:  The patient was maintained on a cardiac monitor.  I personally viewed and interpreted the cardiac monitored which showed an underlying rhythm of: Sinus rhythm, borderline short PR interval elevated CK at 717, delta troponin less than 15, hypokalemia 3.2, elevated calcium at 10.9, anion gap 16, negative Tylenol  and salicylate   Problem List / ED Course / Critical interventions / Medication management I ordered medication including IVF and Zofran  I have reviewed the patients home medicines and have made adjustments as needed  Test / Admission - Considered:  Consider for  admission or further workup however patient's vital signs, physical exam, labs, and imaging are reassuring.  Patient symptoms likely due to mild dehydration.  Patient safe to be discharged back to [facility].     Final diagnoses:  Mild dehydration    ED Discharge Orders     None          Francis Ileana SAILOR, PA-C 09/08/24 1642    Lenor Hollering, MD 09/09/24 [phone]

## 2024-09-08 NOTE — ED Notes (Signed)
 Patient is in the bedroom asleep. NAD

## 2024-09-08 NOTE — ED Notes (Signed)
 Pt continues to display confusion, pt is ambulatory and talking. RN called rport to RN at [facility], non emergent EMS has just arrived to unit to transport pt.

## 2024-09-08 NOTE — ED Notes (Signed)
 Pt currently sleeping in bed. Pt restless, has asked to speak with RN twice thus far, but declines when RN goes to room.

## 2024-09-08 NOTE — ED Notes (Signed)
[employer] called to transport pt 

## 2024-09-08 NOTE — ED Notes (Addendum)
 Pt is in her room tossing and turning and yelling. Said she wants something for upset stomach

## 2024-09-08 NOTE — ED Notes (Signed)
 Items sent with pt to ER. Pt escorted off unit by EMS, sheriff arrived for transport.
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
## 2024-09-08 NOTE — ED Notes (Signed)
 Sitter unable to get pt vitals, pt refused

## 2024-09-08 NOTE — Group Note (Unsigned)
 Group Topic: Recovery Basics  Group Date: 09/08/2024 Start Time: 2000 End Time: 2100 Facilitators: Anice Benton LABOR, NT  Department: [facility]  Number of Participants: 6  Group Focus: abuse issues and self-awareness (AA Group) Treatment Modality:  Patient-Centered Therapy Interventions utilized were group exercise Purpose: relapse prevention strategies(AA Group)   Name: Kelly [name] Date of Birth: 09/08/2001  MR: [id number]    Level of Participation: {THERAPIES; PSYCH GROUP PARTICIPATION OZCZO:76008} Quality of Participation: {THERAPIES; PSYCH QUALITY OF PARTICIPATION:23992} Interactions with others: {THERAPIES; PSYCH INTERACTIONS:23993} Mood/Affect: {THERAPIES; PSYCH MOOD/AFFECT:23994} Triggers (if applicable): *** Cognition: {THERAPIES; PSYCH COGNITION:23995} Progress: {THERAPIES; PSYCH PROGRESS:23997} Response: *** Plan: {THERAPIES; PSYCH EOJW:76003}  Patients Problems:  Patient Active Problem List   Diagnosis Date Noted   Opioid use disorder, severe, dependence (HCC) 09/07/2024   Cocaine use disorder, moderate, dependence (HCC) 09/07/2024   Polysubstance abuse (HCC) 09/07/2024   Preterm premature rupture of membranes (PPROM) with unknown onset of labor 02/03/2018   Supervision of other normal pregnancy, antepartum 09/29/2017   Group B streptococcal bacteriuria 08/05/2017   Chlamydia infection affecting pregnancy 07/13/2016   Mood disorder    Disruptive, impulse control, 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

## 2024-09-08 NOTE — ED Notes (Signed)
 Pt discharged re-admit to [facility].

## 2024-09-08 NOTE — Care Management (Addendum)
 [facility] Care Management  Patient declined in patient treatment. Patient is requesting CD-IOP  Patient has been referred to CD-IOP program.  CD-IOP appt on Monday 09/11/24 at 1pm upstairs.  The MD will give us  the discharge date.  She may stay until Monday and then go straight to the appt.    All depends on the MD decision

## 2024-09-08 NOTE — ED Triage Notes (Signed)
 Pt arrives via EMS from [facility]. Pt reportedly had an abnormal EKG at the facility and was sent for further workup.   Pt has outbursts of shouting.

## 2024-09-08 NOTE — ED Notes (Signed)
Patient refused the EKG.

## 2024-09-08 NOTE — ED Notes (Signed)
 Unable to get pt's vitals at this time due to agitation

## 2024-09-08 NOTE — ED Notes (Signed)
 Pt wont stop banging on the walls. MHT has went into her room to explain that there are others trying to sleep that we can't keep screaming and bagging on the walls.

## 2024-09-08 NOTE — ED Notes (Signed)
 Pt is repeatedly stripping naked and now masturbating in hallway in front of staff, pts, and other visitors. This tech had pt redress multiple times. Pt yelling and cussing at staff demanding fentanyl . Security called.

## 2024-09-08 NOTE — Group Note (Signed)
 Group Topic: Relapse and Recovery  Group Date: 09/08/2024 Start Time: 2000 End Time: 2100 Facilitators: Anice Benton LABOR, NT  Department: [facility]  Number of Participants: 6  Group Focus: abuse issues, relapse prevention, and self-awareness (AA Group) Treatment Modality:  Patient-Centered Therapy Interventions utilized were group exercise Purpose: relapse prevention strategies (AA Group)  Name: Kelly [name] Date of Birth: 01/05/2001  MR: [id number]    Level of Participation: Did Not Attend( wasn't on unit) Quality of Participation: N/A Interactions with others: N/A Mood/Affect: N/A Triggers (if applicable): N/A Cognition: N/A Progress: N/A Response: N/A Plan: patient will be encouraged to attend groups   Patients Problems:  Patient Active Problem List   Diagnosis Date Noted   Opioid use disorder, severe, dependence (HCC) 09/07/2024   Cocaine use disorder, moderate, dependence (HCC) 09/07/2024   Polysubstance abuse (HCC) 09/07/2024   Preterm premature rupture of membranes (PPROM) with unknown onset of labor 02/03/2018   Supervision of other normal pregnancy, antepartum 09/29/2017   Group B streptococcal bacteriuria 08/05/2017   Chlamydia infection affecting pregnancy 07/13/2016   Mood disorder    Disruptive, impulse control, 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

## 2024-09-09 ENCOUNTER — Inpatient Hospital Stay (HOSPITAL_COMMUNITY)
Admission: AD | Admit: 2024-09-09 | Discharge: 2024-09-14 | DRG: 897 | Disposition: A | Discharge status: Home / Self Care | Payer: MEDICAID | Source: Intra-hospital

## 2024-09-09 ENCOUNTER — Other Ambulatory Visit: Payer: Self-pay

## 2024-09-09 ENCOUNTER — Encounter (HOSPITAL_COMMUNITY): Payer: Self-pay | Admitting: Behavioral Health

## 2024-09-09 ENCOUNTER — Encounter (HOSPITAL_COMMUNITY): Payer: Self-pay | Admitting: Student in an Organized Health Care Education/Training Program

## 2024-09-09 DIAGNOSIS — F1994 Other psychoactive substance use, unspecified with psychoactive substance-induced mood disorder: Secondary | ICD-10-CM | POA: Diagnosis present

## 2024-09-09 DIAGNOSIS — F1510 Other stimulant abuse, uncomplicated: Secondary | ICD-10-CM | POA: Diagnosis not present

## 2024-09-09 DIAGNOSIS — Z604 Social exclusion and rejection: Secondary | ICD-10-CM | POA: Diagnosis present

## 2024-09-09 DIAGNOSIS — F1910 Other psychoactive substance abuse, uncomplicated: Secondary | ICD-10-CM | POA: Diagnosis present

## 2024-09-09 DIAGNOSIS — F1120 Opioid dependence, uncomplicated: Secondary | ICD-10-CM | POA: Diagnosis not present

## 2024-09-09 DIAGNOSIS — F1514 Other stimulant abuse with stimulant-induced mood disorder: Secondary | ICD-10-CM | POA: Diagnosis present

## 2024-09-09 DIAGNOSIS — Z5900 Homelessness unspecified: Secondary | ICD-10-CM | POA: Diagnosis not present

## 2024-09-09 DIAGNOSIS — Z653 Problems related to other legal circumstances: Secondary | ICD-10-CM

## 2024-09-09 DIAGNOSIS — Z818 Family history of other mental and behavioral disorders: Secondary | ICD-10-CM | POA: Diagnosis not present

## 2024-09-09 DIAGNOSIS — F32A Depression, unspecified: Secondary | ICD-10-CM | POA: Diagnosis present

## 2024-09-09 DIAGNOSIS — Z5941 Food insecurity: Secondary | ICD-10-CM | POA: Diagnosis not present

## 2024-09-09 DIAGNOSIS — F1310 Sedative, hypnotic or anxiolytic abuse, uncomplicated: Secondary | ICD-10-CM | POA: Diagnosis present

## 2024-09-09 DIAGNOSIS — R569 Unspecified convulsions: Secondary | ICD-10-CM | POA: Diagnosis present

## 2024-09-09 DIAGNOSIS — F17210 Nicotine dependence, cigarettes, uncomplicated: Secondary | ICD-10-CM | POA: Diagnosis present

## 2024-09-09 DIAGNOSIS — F419 Anxiety disorder, unspecified: Secondary | ICD-10-CM | POA: Diagnosis present

## 2024-09-09 DIAGNOSIS — F1123 Opioid dependence with withdrawal: Secondary | ICD-10-CM | POA: Diagnosis present

## 2024-09-09 DIAGNOSIS — F1124 Opioid dependence with opioid-induced mood disorder: Secondary | ICD-10-CM | POA: Diagnosis present

## 2024-09-09 DIAGNOSIS — F1420 Cocaine dependence, uncomplicated: Secondary | ICD-10-CM | POA: Diagnosis not present

## 2024-09-09 DIAGNOSIS — K219 Gastro-esophageal reflux disease without esophagitis: Secondary | ICD-10-CM | POA: Diagnosis present

## 2024-09-09 DIAGNOSIS — Z5982 Transportation insecurity: Secondary | ICD-10-CM

## 2024-09-09 DIAGNOSIS — F1424 Cocaine dependence with cocaine-induced mood disorder: Secondary | ICD-10-CM | POA: Diagnosis present

## 2024-09-09 DIAGNOSIS — F913 Oppositional defiant disorder: Secondary | ICD-10-CM | POA: Diagnosis present

## 2024-09-09 DIAGNOSIS — Z79899 Other long term (current) drug therapy: Secondary | ICD-10-CM

## 2024-09-09 DIAGNOSIS — F1914 Other psychoactive substance abuse with psychoactive substance-induced mood disorder: Secondary | ICD-10-CM | POA: Diagnosis not present

## 2024-09-09 DIAGNOSIS — F1210 Cannabis abuse, uncomplicated: Secondary | ICD-10-CM | POA: Diagnosis not present

## 2024-09-09 DIAGNOSIS — F3481 Disruptive mood dysregulation disorder: Secondary | ICD-10-CM | POA: Diagnosis present

## 2024-09-09 DIAGNOSIS — E860 Dehydration: Secondary | ICD-10-CM | POA: Diagnosis not present

## 2024-09-09 DIAGNOSIS — F12188 Cannabis abuse with other cannabis-induced disorder: Secondary | ICD-10-CM | POA: Diagnosis present

## 2024-09-09 MED ORDER — NICOTINE POLACRILEX 2 MG MT GUM
2.0000 mg | CHEWING_GUM | OROMUCOSAL | Status: DC | PRN
  Administered 2024-09-09 – 2024-09-14 (×15): 2 mg via ORAL
  Filled 2024-09-09 (×8): qty 1

## 2024-09-09 MED ORDER — ALUM & MAG HYDROXIDE-SIMETH 200-200-20 MG/5ML PO SUSP: 30.0000 mL | ORAL | Status: DC | PRN

## 2024-09-09 MED ORDER — HALOPERIDOL LACTATE 5 MG/ML IJ SOLN: 10.0000 mg | Freq: Three times a day (TID) | INTRAMUSCULAR | Status: DC | PRN

## 2024-09-09 MED ORDER — TRAZODONE HCL 50 MG PO TABS
50.0000 mg | ORAL_TABLET | Freq: Every evening | ORAL | Status: DC | PRN
  Administered 2024-09-09 – 2024-09-13 (×5): 50 mg via ORAL
  Filled 2024-09-09 (×5): qty 1

## 2024-09-09 MED ORDER — HALOPERIDOL 5 MG PO TABS: 5.0000 mg | ORAL_TABLET | Freq: Three times a day (TID) | ORAL | Status: DC | PRN

## 2024-09-09 MED ORDER — LORAZEPAM 2 MG/ML IJ SOLN: 2.0000 mg | Freq: Three times a day (TID) | INTRAMUSCULAR | Status: DC | PRN

## 2024-09-09 MED ORDER — NAPROXEN 500 MG PO TABS
500.0000 mg | ORAL_TABLET | Freq: Two times a day (BID) | ORAL | Status: AC | PRN
  Administered 2024-09-09 – 2024-09-12 (×4): 500 mg via ORAL
  Filled 2024-09-09 (×4): qty 1

## 2024-09-09 MED ORDER — DICYCLOMINE HCL 20 MG PO TABS
20.0000 mg | ORAL_TABLET | Freq: Four times a day (QID) | ORAL | Status: AC | PRN
  Administered 2024-09-10 – 2024-09-12 (×3): 20 mg via ORAL
  Filled 2024-09-09 (×3): qty 1

## 2024-09-09 MED ORDER — ONDANSETRON 4 MG PO TBDP
4.0000 mg | ORAL_TABLET | Freq: Four times a day (QID) | ORAL | Status: AC | PRN
  Administered 2024-09-10 – 2024-09-12 (×4): 4 mg via ORAL
  Filled 2024-09-09 (×4): qty 1

## 2024-09-09 MED ORDER — CLONIDINE HCL 0.1 MG PO TABS: 0.1000 mg | ORAL_TABLET | ORAL | Status: DC

## 2024-09-09 MED ORDER — LOPERAMIDE HCL 2 MG PO CAPS
2.0000 mg | ORAL_CAPSULE | ORAL | Status: DC | PRN
  Administered 2024-09-09: 2 mg via ORAL
  Filled 2024-09-09: qty 1

## 2024-09-09 MED ORDER — POTASSIUM CHLORIDE CRYS ER 20 MEQ PO TBCR
40.0000 meq | EXTENDED_RELEASE_TABLET | Freq: Once | ORAL | Status: AC
  Administered 2024-09-09: 40 meq via ORAL
  Filled 2024-09-09: qty 2

## 2024-09-09 MED ORDER — ONDANSETRON 4 MG PO TBDP
4.0000 mg | ORAL_TABLET | Freq: Four times a day (QID) | ORAL | Status: DC | PRN
  Administered 2024-09-09: 4 mg via ORAL
  Filled 2024-09-09: qty 1

## 2024-09-09 MED ORDER — NAPROXEN 500 MG PO TABS: 500.0000 mg | ORAL_TABLET | Freq: Two times a day (BID) | ORAL | Status: DC | PRN

## 2024-09-09 MED ORDER — CLONIDINE HCL 0.1 MG PO TABS
0.1000 mg | ORAL_TABLET | Freq: Four times a day (QID) | ORAL | Status: AC
  Administered 2024-09-09 – 2024-09-11 (×8): 0.1 mg via ORAL
  Filled 2024-09-09 (×8): qty 1

## 2024-09-09 MED ORDER — ACETAMINOPHEN 325 MG PO TABS
650.0000 mg | ORAL_TABLET | Freq: Four times a day (QID) | ORAL | Status: DC | PRN
  Administered 2024-09-09 – 2024-09-11 (×4): 650 mg via ORAL
  Filled 2024-09-09 (×4): qty 2

## 2024-09-09 MED ORDER — POTASSIUM CHLORIDE CRYS ER 20 MEQ PO TBCR: 40.0000 meq | EXTENDED_RELEASE_TABLET | Freq: Once | ORAL | Status: DC

## 2024-09-09 MED ORDER — ACETAMINOPHEN 325 MG PO TABS: 650.0000 mg | ORAL_TABLET | Freq: Four times a day (QID) | ORAL | Status: DC | PRN

## 2024-09-09 MED ORDER — DIPHENHYDRAMINE HCL 50 MG/ML IJ SOLN: 50.0000 mg | Freq: Three times a day (TID) | INTRAMUSCULAR | Status: DC | PRN

## 2024-09-09 MED ORDER — HALOPERIDOL 5 MG PO TABS
5.0000 mg | ORAL_TABLET | Freq: Three times a day (TID) | ORAL | Status: DC | PRN
  Administered 2024-09-12: 5 mg via ORAL
  Filled 2024-09-09 (×2): qty 1

## 2024-09-09 MED ORDER — METHOCARBAMOL 500 MG PO TABS
500.0000 mg | ORAL_TABLET | Freq: Three times a day (TID) | ORAL | Status: DC | PRN
  Administered 2024-09-09: 500 mg via ORAL
  Filled 2024-09-09: qty 1

## 2024-09-09 MED ORDER — CLONIDINE HCL 0.1 MG PO TABS
0.1000 mg | ORAL_TABLET | Freq: Every day | ORAL | Status: DC
  Administered 2024-09-14: 0.1 mg via ORAL
  Filled 2024-09-09: qty 1

## 2024-09-09 MED ORDER — TRAZODONE HCL 50 MG PO TABS: 50.0000 mg | ORAL_TABLET | Freq: Every evening | ORAL | Status: DC | PRN

## 2024-09-09 MED ORDER — ENSURE PLUS HIGH PROTEIN PO LIQD
237.0000 mL | Freq: Two times a day (BID) | ORAL | Status: DC
  Administered 2024-09-09 – 2024-09-13 (×8): 237 mL via ORAL
  Filled 2024-09-09 (×12): qty 237

## 2024-09-09 MED ORDER — DIPHENHYDRAMINE HCL 25 MG PO CAPS
50.0000 mg | ORAL_CAPSULE | Freq: Three times a day (TID) | ORAL | Status: DC | PRN
  Administered 2024-09-12 – 2024-09-13 (×2): 50 mg via ORAL
  Filled 2024-09-09 (×2): qty 2

## 2024-09-09 MED ORDER — HYDROXYZINE HCL 25 MG PO TABS
25.0000 mg | ORAL_TABLET | Freq: Four times a day (QID) | ORAL | Status: DC | PRN
  Administered 2024-09-09: 25 mg via ORAL
  Filled 2024-09-09: qty 1

## 2024-09-09 MED ORDER — RISPERIDONE 1 MG PO TABS: 1.0000 mg | ORAL_TABLET | Freq: Two times a day (BID) | ORAL | Status: DC

## 2024-09-09 MED ORDER — HYDROXYZINE HCL 25 MG PO TABS: 25.0000 mg | ORAL_TABLET | Freq: Three times a day (TID) | ORAL | Status: DC | PRN

## 2024-09-09 MED ORDER — DICYCLOMINE HCL 20 MG PO TABS: 20.0000 mg | ORAL_TABLET | Freq: Four times a day (QID) | ORAL | Status: DC | PRN

## 2024-09-09 MED ORDER — MAGNESIUM HYDROXIDE 400 MG/5ML PO SUSP: 30.0000 mL | Freq: Every day | ORAL | Status: DC | PRN

## 2024-09-09 MED ORDER — LOPERAMIDE HCL 2 MG PO CAPS: 2.0000 mg | ORAL_CAPSULE | ORAL | Status: AC | PRN

## 2024-09-09 MED ORDER — CLONIDINE HCL 0.1 MG PO TABS: 0.1000 mg | ORAL_TABLET | Freq: Every day | ORAL | Status: DC

## 2024-09-09 MED ORDER — CLONIDINE HCL 0.1 MG PO TABS
0.1000 mg | ORAL_TABLET | ORAL | Status: AC
  Administered 2024-09-11 – 2024-09-13 (×4): 0.1 mg via ORAL
  Filled 2024-09-09 (×4): qty 1

## 2024-09-09 MED ORDER — CLONIDINE HCL 0.1 MG PO TABS: 0.1000 mg | ORAL_TABLET | Freq: Four times a day (QID) | ORAL | Status: DC

## 2024-09-09 MED ORDER — RISPERIDONE 1 MG PO TABS
1.0000 mg | ORAL_TABLET | Freq: Two times a day (BID) | ORAL | Status: DC
  Administered 2024-09-09 – 2024-09-10 (×2): 1 mg via ORAL
  Filled 2024-09-09 (×2): qty 1

## 2024-09-09 MED ORDER — HALOPERIDOL LACTATE 5 MG/ML IJ SOLN: 5.0000 mg | Freq: Three times a day (TID) | INTRAMUSCULAR | Status: DC | PRN

## 2024-09-09 MED ORDER — HYDROXYZINE HCL 25 MG PO TABS
25.0000 mg | ORAL_TABLET | Freq: Four times a day (QID) | ORAL | Status: AC | PRN
  Administered 2024-09-09 – 2024-09-13 (×11): 25 mg via ORAL
  Filled 2024-09-09 (×11): qty 1

## 2024-09-09 MED ORDER — DIPHENHYDRAMINE HCL 50 MG PO CAPS: 50.0000 mg | ORAL_CAPSULE | Freq: Three times a day (TID) | ORAL | Status: DC | PRN

## 2024-09-09 MED ORDER — METHOCARBAMOL 500 MG PO TABS
500.0000 mg | ORAL_TABLET | Freq: Three times a day (TID) | ORAL | Status: AC | PRN
  Administered 2024-09-09 – 2024-09-11 (×3): 500 mg via ORAL
  Filled 2024-09-09 (×3): qty 1

## 2024-09-09 MED ORDER — MAGNESIUM HYDROXIDE 400 MG/5ML PO SUSP
30.0000 mL | Freq: Every day | ORAL | Status: DC | PRN
  Administered 2024-09-11: 30 mL via ORAL
  Filled 2024-09-09: qty 30

## 2024-09-09 NOTE — Plan of Care (Signed)
  Problem: Safety: Goal: Periods of time without injury will increase Outcome: Progressing   Problem: Coping: Goal: Ability to identify and develop effective coping behavior will improve Outcome: Progressing

## 2024-09-09 NOTE — ED Notes (Signed)
 Pt is sleeping at the moment. No acute distress noted. Respirations are even and labored. Q15 safety checks in place.

## 2024-09-09 NOTE — ED Notes (Signed)
 Pt returned from the ED to the [facility] after being medically cleared. During assessment, pt is intoxicated, confused, response to which of the questions to answer and those that she refuses to answer. Pt is restless, unsteady walking, difficult to be redirected. Pt is loud when making a request. Admission process completed. Pt provided with meal. Q15 safety checks in place as per facility protocol.

## 2024-09-09 NOTE — Progress Notes (Signed)
(  Sleep Hours) -7.75  (Any PRNs that were needed, meds refused, or side effects to meds)- Vistaril 25 mg , Trazodone 50 mg, Vistaril 25 mg , Zofran  4 mg  (Any disturbances and when (visitation, over night)- Pt presents with med seeking behaviors  (Concerns raised by the patient)- denies , Ask me 3 discussed with pt and verbal understanding stated.  Pt came to the nursing station give me everything I can have writer informed pt to tell her Sx and any medication she could have would be given. Pt statedI threw up , I'm withdrawing from Fentanyl  , I need medication for agitation that will help me sleep, they told me to ask for it at night because they said they can't give it to me in the day    (SI/HI/AVH)- denies

## 2024-09-09 NOTE — [facility] Group Notes (Signed)
 Adult Psychoeducational Group Note  Date:  09/09/2024 Time:  8:40 PM  Group Topic/Focus:  Wrap-Up Group:   The focus of this group is to help patients review their daily goal of treatment and discuss progress on daily workbooks.  Participation Level:  Did Not Attend  Kelly [name] 09/09/2024, 8:40 PM

## 2024-09-09 NOTE — Tx Team (Signed)
 Initial Treatment Plan 09/09/2024 7:25 PM [facility] FMW:[id number]    PATIENT STRESSORS: Financial difficulties   Occupational concerns   Substance abuse     PATIENT STRENGTHS: Capable of independent living  Communication skills  Supportive family/friends    PATIENT IDENTIFIED PROBLEMS: Substance Abuse I've been using Cocaine, weed & fentanyl .    Grieving My father died a year ago, I started using more drugs since then.    Homelessness             DISCHARGE CRITERIA:  Improved stabilization in mood, thinking, and/or behavior Verbal commitment to aftercare and medication compliance Withdrawal symptoms are absent or subacute and managed without 24-hour nursing intervention  PRELIMINARY DISCHARGE PLAN: Outpatient therapy Placement in alternative living arrangements  PATIENT/FAMILY INVOLVEMENT: This treatment plan has been presented to and reviewed with the patient, [facility]. The patient have been given the opportunity to ask questions and make suggestions.  Denisa Enterline, RN 09/09/2024, 7:25 PM
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
## 2024-09-09 NOTE — ED Notes (Signed)
 Patient transferred to [facility] via [employer]. Pt asked staff and security Will this new hospital give me the medicine's I want. I want the meds they gave me at the hospital last night. Patient escorted to [facility] port via staff for transport to [facility]. Safety maintained.

## 2024-09-09 NOTE — ED Notes (Signed)
 Pt approached nurses station requesting medication for diarrhea and anxiety. Pt states, I can't get some phenergan  like they gave me in the hospital? I want to sleep. Pt instructed that she may have Zofran  for nausea if needed. Pt declined. Pt received Vistaril and Immodium for sx mgmt. Pt discheveled, slumped over nurses station asking for medication to go to sleep. Pt redirected. Pt proceeded to her room to rest.

## 2024-09-09 NOTE — Plan of Care (Signed)
   Problem: Activity: Goal: Sleeping patterns will improve Outcome: Progressing   Problem: Safety: Goal: Periods of time without injury will increase Outcome: Progressing
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
## 2024-09-09 NOTE — ED Provider Notes (Signed)
 Patient received from [facility] Emergency [facility] following medical clearance for evaluation of abnormal EKG and somnolence. On arrival, patient was observed ambulating in the hallway without apparent distress. She is refusing to participate in assessment at this time. Vital signs are stable, and she denies any complaints. Will continue to monitor patient.

## 2024-09-09 NOTE — ED Notes (Signed)
 Pt refused new medications stating, I want the 3 meds they gave me at the hosp last night. I don't want anything else. Provider made aware.

## 2024-09-09 NOTE — Discharge Instructions (Signed)
 Please transfer patient to the [facility] for treatment and stabilization of her mental styatus

## 2024-09-09 NOTE — ED Notes (Addendum)
 Pt woke up and defecated on bed and floor of bedroom, then returned to bed. Instructed by staff to help clean area by removing linen off bed, pt proceeded to put on clean scrubs provided by staff and lay on soiled bed. Pt instructed to take shower while staff and EVS cleaned room. Pt proceeded to lay on floor in hallway. Redirected pt to bathroom. Room cleaned. Pt came out of room naked asking for cereal and milk. Pt redirected to her room. Room cleaned via staff. Pt left room to eat bkft. Pt coughing and hawking in cafeteria where peers were eating bkft. Staff directed pt to go to bathroom if needed as her behaviors potentates infection in dining area and discomfort for her peers. Pt, agitated, returned to her room.Safety maintained.

## 2024-09-09 NOTE — ED Notes (Signed)
 Pt accepted to [facility] 505-1. Nurse to nurse report given to Olivette, RN, @[facility] . [employer] called for transport. Pt made aware. Safety maintained.

## 2024-09-10 LAB — RPR
RPR Ser Ql: REACTIVE — AB
RPR Titer: 1:8 {titer}

## 2024-09-10 LAB — LIPID PANEL
Cholesterol: 150 mg/dL (ref 0–200)
HDL: 42 mg/dL (ref >40)
LDL Cholesterol: 90 mg/dL (ref 0–99)
Total CHOL/HDL Ratio: 3.6 ratio
Triglycerides: 92 mg/dL (ref <150)
VLDL: 18 mg/dL (ref 0–40)

## 2024-09-10 LAB — SEDIMENTATION RATE: Sed Rate: 18 mm/h (ref 0–22)

## 2024-09-10 LAB — VITAMIN B12: Vitamin B-12: 446 pg/mL (ref 180–914)

## 2024-09-10 LAB — AMMONIA: Ammonia: 32 umol/L (ref 9–35)

## 2024-09-10 LAB — HEMOGLOBIN A1C
Hgb A1c MFr Bld: 4.6 % — ABNORMAL LOW (ref 4.8–5.6)
Mean Plasma Glucose: 85.32 mg/dL

## 2024-09-10 LAB — TSH: TSH: 1.52 u[IU]/mL (ref 0.350–4.500)

## 2024-09-10 LAB — HIV ANTIBODY (ROUTINE TESTING W REFLEX): HIV Screen 4th Generation wRfx: NONREACTIVE

## 2024-09-10 LAB — VITAMIN D 25 HYDROXY (VIT D DEFICIENCY, FRACTURES): Vit D, 25-Hydroxy: 31.5 ng/mL (ref 30–100)

## 2024-09-10 MED ORDER — ARIPIPRAZOLE 2 MG PO TABS
2.0000 mg | ORAL_TABLET | Freq: Every day | ORAL | Status: DC
  Administered 2024-09-10 – 2024-09-11 (×2): 2 mg via ORAL
  Filled 2024-09-10 (×2): qty 1

## 2024-09-10 MED ORDER — METOPROLOL SUCCINATE ER 25 MG PO TB24
12.5000 mg | ORAL_TABLET | Freq: Every day | ORAL | Status: DC
  Administered 2024-09-10 – 2024-09-11 (×2): 12.5 mg via ORAL
  Filled 2024-09-10 (×2): qty 1

## 2024-09-10 MED ORDER — BUPRENORPHINE HCL-NALOXONE HCL 2-0.5 MG SL SUBL
2.0000 | SUBLINGUAL_TABLET | Freq: Two times a day (BID) | SUBLINGUAL | Status: DC
  Administered 2024-09-10 – 2024-09-12 (×4): 2 via SUBLINGUAL
  Filled 2024-09-10 (×4): qty 2

## 2024-09-10 NOTE — [facility] Group Notes (Signed)
 Adult Psychoeducational Group Note  Date:  09/10/2024 Time:  6:38 PM  Group Topic/Focus:  Goals Group:   The focus of this group is to help patients establish daily goals to achieve during treatment and discuss how the patient can incorporate goal setting into their daily lives to aide in recovery.  Participation Level:  na  Participation Quality:  na  Affect:  na  Cognitive:  na  Insight: na  Engagement in Group:  na  Modes of Intervention:  na  Additional Comments:  The patient did not attend group  Lashann Hagg Lee 09/10/2024, 6:38 PM

## 2024-09-10 NOTE — Progress Notes (Signed)
 PSA 1st attempt  CSW attempted to complete PSA, Consents and ROI 10.12.25 @1315 .  CSW explained her role, pt declines at this time to speak with a Child psychotherapist at this time. CSW made a note on [facility] handoff for weekday CSW to follow-up with pt.    Golda Louder LCSWA 1315 10.12.25
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
## 2024-09-10 NOTE — Plan of Care (Addendum)
 Pt reports she slept well last night with fair appetite. Denies SI, HI and AVH when assessed. Presents anxious with avertive eye contact, logical soft speech I'm tired, I feel like I'm going to pass out, I can't do this no more. I'm hurting all over. COWS rates at 10-12 this shift. Received PRNs as ordered for withdrawal protocol (Naprosyn , Robaxin, Bentyl) with desired effect when reassessed. Safety checks maintained at Q 15 minutes intervals. Pt is verbally redirectable at this time. Support, encouragement and reassurance offered.   Problem: Coping: Goal: Ability to demonstrate self-control will improve Outcome: Progressing   Problem: Health Behavior/Discharge Planning: Goal: Compliance with treatment plan for underlying cause of condition will improve Outcome: Progressing   Problem: Safety: Goal: Periods of time without injury will increase Outcome: Progressing
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
## 2024-09-11 ENCOUNTER — Encounter (HOSPITAL_COMMUNITY): Payer: Self-pay

## 2024-09-11 LAB — ENA+DNA/DS+ANTICH+CENTRO+JO...
Anti JO-1: <0.2 AI (ref 0.0–0.9)
Centromere Ab Screen: <0.2 AI (ref 0.0–0.9)
Chromatin Ab SerPl-aCnc: <0.2 AI (ref 0.0–0.9)
ENA SM Ab Ser-aCnc: <0.2 AI (ref 0.0–0.9)
Ribonucleic Protein: 1.4 AI — ABNORMAL HIGH (ref 0.0–0.9)
SSA (Ro) (ENA) Antibody, IgG: <0.2 AI (ref 0.0–0.9)
SSB (La) (ENA) Antibody, IgG: <0.2 AI (ref 0.0–0.9)
Scleroderma (Scl-70) (ENA) Antibody, IgG: <0.2 AI (ref 0.0–0.9)
ds DNA Ab: <1 [IU]/mL (ref 0–9)

## 2024-09-11 LAB — ANA W/REFLEX IF POSITIVE: Anti Nuclear Antibody (ANA): POSITIVE — AB

## 2024-09-11 LAB — CERULOPLASMIN: Ceruloplasmin: 32.1 mg/dL (ref 19.0–39.0)

## 2024-09-11 MED ORDER — MIRTAZAPINE 15 MG PO TABS
15.0000 mg | ORAL_TABLET | Freq: Every day | ORAL | Status: DC
Start: 2024-09-11 — End: 2024-09-14
  Administered 2024-09-11 – 2024-09-13 (×3): 15 mg via ORAL
  Filled 2024-09-11 (×3): qty 1

## 2024-09-11 MED ORDER — ARIPIPRAZOLE 5 MG PO TABS
5.0000 mg | ORAL_TABLET | Freq: Every day | ORAL | Status: DC
  Administered 2024-09-12 – 2024-09-14 (×3): 5 mg via ORAL
  Filled 2024-09-11 (×3): qty 1

## 2024-09-11 NOTE — Group Note (Signed)
 LCSW Group Therapy Note   Group Date: 09/11/2024 Start Time: 1300 End Time: 1400   Participation:  did not attend  Type of Therapy:  Group Therapy  Topic:  Healing Flames: Navigating Anger with Compassion  Objective:  Foster self-awareness and promote compassion toward oneself and others when dealing with anger.  Goals:  Help participants understand the underlying emotions and needs fueling anger. Provide coping strategies for healthier emotional expression and anger management.  Summary: This session explored anger as a volcano--an explosion driven by deeper feelings and unmet needs. Participants learned to identify anger triggers and underlying emotions, then practiced coping strategies like deep breathing, physical activity, and journaling. The group discussed healthy ways to manage anger before it escalates, using both personal reflection and shared experiences.  Therapeutic Modalities: Cognitive Behavioral Therapy (CBT): Challenging thoughts that fuel anger. Mindfulness: Increasing awareness of emotions and sensations.   Ashlan Dignan O Marlisha Vanwyk, LCSWA 09/11/2024  3:08 PM

## 2024-09-11 NOTE — Progress Notes (Signed)
 Recreation Therapy Notes  INPATIENT RECREATION THERAPY ASSESSMENT  Patient Details Name: Kelly [name] MRN: [id number] DOB: [birth year] Today's Date: 09/11/2024       Information Obtained From: Patient  Able to Participate in Assessment/Interview: Yes  Patient Presentation: Alert  Reason for Admission (Per Patient): Substance Abuse  Patient Stressors:  (None identified)  Coping Skills:   Write, Sports, TV, Music, Substance Abuse, Talk, Art, Prayer, Avoidance, Read, Hot Bath/Shower  Leisure Interests (2+):  Art - Draw, Art - Coloring, Music - Listen, Individual - Other (Comment) (Watch movies)  Frequency of Recreation/Participation: Weekly  Awareness of Community Resources:  Yes  Community Resources:  Coffee Shop, Research scientist (physical sciences), Tree surgeon  Current Use: Yes  If no, Barriers?:    Expressed Interest in [employer] Information: No  County of Residence:  [place]  Patient Main Form of Transportation: Set designer  Patient Strengths:  Staying Positive; Good Listener/Leader  Patient Identified Areas of Improvement:  Staying off drugs  Patient Goal for Hospitalization:  maintain a positive attitude and positive thoughts  Current SI (including self-harm):  No  Current HI:  No  Current AVH: No  Staff Intervention Plan: Group Attendance, Collaborate with Interdisciplinary Treatment Team  Consent to Intern Participation: N/A   Nazareth Norenberg-McCall, LRT,CTRS Johara Lodwick A Aashrith Eves-McCall 09/11/2024, 1:07 PM

## 2024-09-11 NOTE — Progress Notes (Addendum)
 [facility] MD Progress Note  09/11/2024 11:37 AM Kelly [name]  MRN:  [id number]  Principal Problem: Opioid use disorder, severe, dependence (HCC) Diagnosis: Principal Problem:   Opioid use disorder, severe, dependence (HCC) Active Problems:   Marijuana abuse   Benzodiazepine abuse (HCC)   Cocaine use disorder, moderate, dependence (HCC)   Polysubstance abuse (HCC)   Substance induced mood disorder (HCC)    Reason for Admission:  The patient is a 23 year old female with a prior history of 1 psychiatric hospitalization as an adolescent for DMDD.  She presented under involuntary commitment, paperwork filled out by her sister, who reported increasing substance use and concern for suicidal behavior.  She is currently admitted to the [facility] behavioral health hospital on an involuntary basis.   Information obtained from 24-hour nursing report: No significant events   Information Obtained Today During Patient Interview:  The patient appears anxious today during the interview, and she asks several times about discharge.  She denies experiencing significant symptoms of opioid withdrawal.  She denies experiencing depression or thoughts of self-harm.  She reports good sleep and appetite.  (Throughout the interview it appears that she is reporting information she thinks will allow her to be discharged to soon as possible).  She says that she is interested in staying away from opioids going forward and that she intends to follow-up [facility].  She has several medication requests.  Most of these were inappropriate.  We did agree on starting Remeron.  Bipolar affective disorder screening was negative.  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   GERD (gastroesophageal reflux disease)    Gonorrhea    Seizures (HCC)    one seizure August 2016   Vomiting     Past Surgical History:  Procedure Laterality Date   tubes in ears     Family History:  Family History  Problem Relation Age of Onset   Healthy Mother    Cancer Maternal Uncle    Family Psychiatric  History: as per H and P Social History:  Social History   Substance and Sexual Activity  Alcohol Use No     Social History   Substance and Sexual Activity  Drug Use Yes   Types: Marijuana   Comment: Occassionally    Social History   Socioeconomic History   Marital status: Single    Spouse name: Not on file   Number of children: Not on file   Years of education: Not on file   Highest education level: Not on file  Occupational History   Not on file  Tobacco Use   Smoking status: Every Day    Current packs/day: 0.00    Types: Cigarettes, E-cigarettes    Last attempt to quit: 05/25/2016    Years since quitting: 8.3   Smokeless tobacco: Never  Vaping Use   Vaping status: Never Used  Substance and Sexual Activity   Alcohol use: No   Drug use: Yes    Types: Marijuana    Comment: Occassionally   Sexual activity: Yes    Birth control/protection: None    Comment: Last encounter: Mid October 2024  Other Topics Concern   Not on file  Social History  Narrative   Not on file   Social Drivers of Health   Financial Resource Strain: Low Risk  (04/06/2022)   Overall Financial Resource Strain (CARDIA)    Difficulty of Paying Living Expenses: Not very hard  Food Insecurity: Food Insecurity Present (09/09/2024)   Hunger Vital Sign    Worried About Running Out of Food in the Last Year: Sometimes true    Ran Out of Food in the Last Year: Sometimes true  Transportation Needs: Unmet Transportation Needs (09/09/2024)   PRAPARE - Administrator, Civil Service (Medical): Yes    Lack of Transportation  (Non-Medical): Yes  Physical Activity: Insufficiently Active (04/06/2022)   Exercise Vital Sign    Days of Exercise per Week: 3 days    Minutes of Exercise per Session: 20 min  Stress: Stress Concern Present (04/06/2022)   [employer] of Occupational Health - Occupational Stress Questionnaire    Feeling of Stress : To some extent  Social Connections: Socially Isolated (04/06/2022)   Social Connection and Isolation Panel    Frequency of Communication with Friends and Family: Once a week    Frequency of Social Gatherings with Friends and Family: Never    Attends Religious Services: Never    Diplomatic Services operational officer: No    Attends Engineer, structural: Never    Marital Status: Never married   Additional Social History:                         Sleep: fair  Appetite: fair  Current Medications: Current Facility-Administered Medications  Medication Dose Route Frequency Provider Last Rate Last Admin   acetaminophen  (TYLENOL ) tablet 650 mg  650 mg Oral Q6H PRN Tex Drilling, NP   650 mg at 09/11/24 1006   alum & mag hydroxide-simeth (MAALOX/MYLANTA) 200-200-20 MG/5ML suspension 30 mL  30 mL Oral Q4H PRN Tex Drilling, NP       [START ON 09/12/2024] ARIPiprazole  (ABILIFY ) tablet 5 mg  5 mg Oral Daily Marry Clamp, MD       buprenorphine-naloxone (SUBOXONE) 2-0.5 mg per SL tablet 2 tablet  2 tablet Sublingual BID Chandra Charleston Sherlean Ruth, DO   2 tablet at 09/11/24 9196   cloNIDine (CATAPRES) tablet 0.1 mg  0.1 mg Oral QID Nkwenti, Doris, NP   0.1 mg at 09/11/24 0803   Followed by   cloNIDine (CATAPRES) tablet 0.1 mg  0.1 mg Oral BH-qamhs Nkwenti, Doris, NP       Followed by   NOREEN ON 09/14/2024] cloNIDine (CATAPRES) tablet 0.1 mg  0.1 mg Oral QAC breakfast Nkwenti, Doris, NP       dicyclomine (BENTYL) tablet 20 mg  20 mg Oral Q6H PRN Tex Drilling, NP   20 mg at 09/10/24 1503   haloperidol (HALDOL) tablet 5 mg  5 mg Oral TID PRN Tex Drilling,  NP       And   diphenhydrAMINE  (BENADRYL ) capsule 50 mg  50 mg Oral TID PRN Tex Drilling, NP       haloperidol lactate (HALDOL) injection 10 mg  10 mg Intramuscular TID PRN Tex Drilling, NP       And   diphenhydrAMINE  (BENADRYL ) injection 50 mg  50 mg Intramuscular TID PRN Tex Drilling, NP       And   LORazepam  (ATIVAN ) injection 2 mg  2 mg Intramuscular TID PRN Tex Drilling, NP       haloperidol lactate (HALDOL) injection 5 mg  5 mg Intramuscular TID PRN Tex Drilling, NP       And   diphenhydrAMINE  (BENADRYL ) injection 50 mg  50 mg Intramuscular TID PRN Tex Drilling, NP       And   LORazepam  (ATIVAN ) injection 2 mg  2 mg Intramuscular TID PRN Tex Drilling, NP       feeding supplement (ENSURE PLUS HIGH PROTEIN) liquid 237 mL  237 mL Oral BID BM Pashayan, Alexander S, DO   237 mL at 09/11/24 1007   hydrOXYzine (ATARAX) tablet 25 mg  25 mg Oral Q6H PRN Tex Drilling, NP   25 mg at 09/11/24 1006   loperamide (IMODIUM) capsule 2-4 mg  2-4 mg Oral PRN Tex Drilling, NP       magnesium  hydroxide (MILK OF MAGNESIA) suspension 30 mL  30 mL Oral Daily PRN Tex Drilling, NP       methocarbamol (ROBAXIN) tablet 500 mg  500 mg Oral Q8H PRN Tex Drilling, NP   500 mg at 09/10/24 0838   mirtazapine (REMERON) tablet 15 mg  15 mg Oral QHS Marry Clamp, MD       naproxen  (NAPROSYN ) tablet 500 mg  500 mg Oral BID PRN Tex Drilling, NP   500 mg at 09/11/24 1006   nicotine polacrilex (NICORETTE) gum 2 mg  2 mg Oral PRN Pashayan, Alexander S, DO   2 mg at 09/11/24 1006   ondansetron  (ZOFRAN -ODT) disintegrating tablet 4 mg  4 mg Oral Q6H PRN Tex Drilling, NP   4 mg at 09/11/24 0412   traZODone (DESYREL) tablet 50 mg  50 mg Oral QHS PRN Tex Drilling, NP   50 mg at 09/10/24 2104    Lab Results:  Results for orders placed or performed during the hospital encounter of 09/09/24 (from the past 48 hours)  Ammonia     Status: None   Collection Time: 09/10/24  6:55 AM  Result Value Ref Range    Ammonia 32 9 - 35 umol/L    Comment: Performed at [facility], [address], [place], [place] [address]  ANA w/Reflex if Positive     Status: Abnormal   Collection Time: 09/10/24  6:55 AM  Result Value Ref Range   Anti Nuclear Antibody (ANA) Positive (A) Negative    Comment: (NOTE) Performed At: [facility] [address] [place], [place] [id number] Jennette Shorter MD Ey:[id number]   Lipid panel     Status: None   Collection Time: 09/10/24  6:55 AM  Result Value Ref Range   Cholesterol 150 0 - 200 mg/dL    Comment:        ATP III CLASSIFICATION:  <200     mg/dL   Desirable  799-760  mg/dL   Borderline High  >=759    mg/dL   High           Triglycerides 92 <150 mg/dL   HDL 42 >59 mg/dL   Total CHOL/HDL Ratio 3.6 RATIO   VLDL 18 0 - 40 mg/dL   LDL Cholesterol 90 0 - 99 mg/dL    Comment:        Total Cholesterol/HDL:CHD Risk Coronary Heart Disease Risk Table                     Men   Women  1/2 Average Risk   3.4   3.3  Average Risk       5.0   4.4  2 X Average Risk   9.6  7.1  3 X Average Risk  23.4   11.0        Use the calculated Patient Ratio above and the CHD Risk Table to determine the patient's CHD Risk.        ATP III CLASSIFICATION (LDL):  <100     mg/dL   Optimal  899-870  mg/dL   Near or Above                    Optimal  130-159  mg/dL   Borderline  839-810  mg/dL   High  >809     mg/dL   Very High Performed at [facility], [address], [place], [place] [address]   Sedimentation rate     Status: None   Collection Time: 09/10/24  6:55 AM  Result Value Ref Range   Sed Rate 18 0 - 22 mm/hr    Comment: Performed at [facility], [address], [place], [place] [address]  HIV Antibody (routine testing w rflx)     Status: None   Collection Time: 09/10/24  6:55 AM  Result Value Ref Range   HIV Screen 4th Generation wRfx Non Reactive Non Reactive    Comment: Performed at [facility], [address] [place], [place] [address]  RPR     Status: Abnormal   Collection Time: 09/10/24  6:55 AM  Result Value Ref Range   RPR Ser Ql Reactive (A) NON REACTIVE    Comment: SENT FOR CONFIRMATION   RPR Titer 1:8     Comment: Performed at [facility], [address] [place], [place] [address]  Hemoglobin A1c     Status: Abnormal   Collection Time: 09/10/24  6:55 AM  Result Value Ref Range   Hgb A1c MFr Bld 4.6 (L) 4.8 - 5.6 %    Comment: (NOTE) Diagnosis of Diabetes The following HbA1c ranges recommended by the American Diabetes Association (ADA) may be used as an aid in the diagnosis of diabetes mellitus.  Hemoglobin             Suggested A1C NGSP%              Diagnosis  <5.7                   Non Diabetic  5.7-6.4                Pre-Diabetic  >6.4                   Diabetic  <7.0                   Glycemic control for                       adults with diabetes.     Mean Plasma Glucose 85.32 mg/dL    Comment: Performed at [facility], [address], [place], [place] [address]  TSH     Status: None   Collection Time: 09/10/24  6:55 AM  Result Value Ref Range   TSH 1.520 0.350 - 4.500 uIU/mL    Comment: Performed at [facility], [address], [place], [place] [address]  Vitamin B12     Status: None   Collection Time: 09/10/24  6:55 AM  Result Value Ref Range   Vitamin B-12 446 180 - 914 pg/mL    Comment: Performed  at [facility], [address], [place], [place] [address]  VITAMIN D 25 Hydroxy (Vit-D Deficiency, Fractures)     Status: None   Collection Time: 09/10/24  6:55 AM  Result Value Ref Range   Vit D, 25-Hydroxy 31.50 30 - 100 ng/mL    Comment: (NOTE) Vitamin D deficiency has been defined by the Institute of Medicine  and an Endocrine Society practice guideline as a level of serum 25-OH  vitamin D less than 20 ng/mL (1,2). The Endocrine Society went on to  further define vitamin D  insufficiency as a level between 21 and 29  ng/mL (2).  1. IOM (Institute of Medicine). 2010. Dietary reference intakes for  calcium and D. [place]: [employer]. 2. Holick MF, Binkley [place], Bischoff-Ferrari HA, et al. Evaluation,  treatment, and prevention of vitamin D deficiency: an Endocrine  Society clinical practice guideline, JCEM. 2011 Jul; 96(7): 1911-30.  Performed at [facility], [address] [place], [place] [address]   ENA+DNA/DS+ANTICH+CENTRO+JO...     Status: Abnormal   Collection Time: 09/10/24  6:55 AM  Result Value Ref Range   ds DNA Ab <1 0 - 9 IU/mL    Comment: (NOTE)                                   Negative      <5                                   Equivocal  5 - 9                                   Positive      >9    Ribonucleic Protein 1.4 (H) 0.0 - 0.9 AI   ENA SM Ab Ser-aCnc <0.2 0.0 - 0.9 AI   Scleroderma (Scl-70) (ENA) Antibody, IgG <0.2 0.0 - 0.9 AI   SSA (Ro) (ENA) Antibody, IgG <0.2 0.0 - 0.9 AI   SSB (La) (ENA) Antibody, IgG <0.2 0.0 - 0.9 AI   Chromatin Ab SerPl-aCnc <0.2 0.0 - 0.9 AI   Anti JO-1 <0.2 0.0 - 0.9 AI   Centromere Ab Screen <0.2 0.0 - 0.9 AI   See below: Comment     Comment: (NOTE) Autoantibody                       Disease Association ------------------------------------------------------------                        Condition                  Frequency ---------------------   ------------------------   --------- Antinuclear Antibody,    SLE, mixed connective Direct (ANA-D)           tissue diseases ---------------------   ------------------------   --------- dsDNA                    SLE                        40 - 60% ---------------------   ------------------------   --------- Chromatin  Drug induced SLE                90%                         SLE                        48 - 97% ---------------------   ------------------------   --------- SSA (Ro)                 SLE                         25 - 35%                         Sjogren's Syndrome         40 - 70%                         Neonatal Lupus                 100% ---------------------   ------------------------   --------- SSB (La)                 SLE                              10%                         Sjogren's Syndrome              30% ---------------------   -----------------------    --------- Sm (anti-Smith)          SLE                        15 - 30% ---------------------   -----------------------    --------- RNP                      Mixed Connective Tissue                         Disease                         95% (U1 nRNP,                SLE                        30 - 50% anti-ribonucleoprotein)  Polymyositis and/or                         Dermatomyositis                 20% ---------------------   ------------------------   --------- Scl-70 (antiDNA          Scleroderma (diffuse)      20 - 35% topoisomerase)           Crest                           13% ---------------------   ------------------------   --------- Jo-1  Polymyositis and/or                         Dermatomyositis            20 - 40% ---------------------   ------------------------   --------- Centromere B             Scleroderma -  Crest                         variant                         80% Performed At: [facility] [address] [place], [place] [id number] Jennette Shorter MD Ey:[id number]     Blood Alcohol level:  Lab Results  Component Value Date   [facility] <15 09/06/2024   ETH <5 11/11/2015    Metabolic Disorder Labs: Lab Results  Component Value Date   HGBA1C 4.6 (L) 09/10/2024   MPG 85.32 09/10/2024   No results found for: PROLACTIN Lab Results  Component Value Date   CHOL 150 09/10/2024   TRIG 92 09/10/2024   HDL 42 09/10/2024   CHOLHDL 3.6 09/10/2024   VLDL 18 09/10/2024   LDLCALC 90 09/10/2024    Physical Findings:   Psychiatric Specialty Exam: Physical  Exam Constitutional:      Appearance: the patient is not toxic-appearing.  Pulmonary:     Effort: Pulmonary effort is normal.  Neurological:     General: No focal deficit present.     Mental Status: the patient is alert and oriented to person, place, and time.   Review of Systems  Respiratory:  Negative for shortness of breath.   Cardiovascular:  Negative for chest pain.  Gastrointestinal:  Negative for abdominal pain, constipation, diarrhea, nausea and vomiting.  Neurological:  Negative for headaches.      BP 123/85 (BP Location: Right Arm)   Pulse (!) 115   Temp (!) 97.3 F (36.3 C) (Oral)   Resp 14   Ht 5' 6 (1.676 m)   Wt 50.9 kg   SpO2 98%   BMI 18.11 kg/m   General Appearance: Fairly Groomed  Eye Contact:  Good  Speech:  Clear and Coherent  Volume:  Normal  Mood:  Euthymic  Affect:  Congruent  Thought Process:  Coherent  Orientation:  Full (Time, Place, and Person)  Thought Content: Logical   Suicidal Thoughts:  No  Homicidal Thoughts:  No  Memory:  Immediate;   Good  Judgement:  fair  Insight:  fair  Psychomotor Activity:  Normal  Concentration:  Concentration: Good  Recall:  Good  Fund of Knowledge: Good  Language: Good  Akathisia:  No  Handed:  not assessed  AIMS (if indicated): not done  Assets:  Communication Skills Desire for Improvement Financial Resources/Insurance Housing Leisure Time Physical Health  ADL's:  Intact  Cognition: WNL  Sleep:  Fair      Treatment Plan Summary: Daily contact with patient to assess and evaluate symptoms and progress in treatment and Medication management  Opioid use disorder - COWS with clonidine taper - Continue Suboxone 4 mg twice daily - Establish follow-up with outpatient prescriber  Substance-induced mood disorder - Increase Abilify  from 2 mg daily to 5 mg daily starting 10/13 - Start Remeron 15 mg nightly  Medical: - Extensive workup ordered, reportedly for first episode psychosis (but  psychosis symptoms do not appear well substantiated in my  review of the chart) - RPR positive with patient denying prior history of syphilis, T. pallidum antibody pending, consult ID - ANA positive with slightly high RNP, can follow-up outpatient - Discontinue metoprolol for tachycardia, patient is already on clonidine taper    Karleen Kaufmann, MD PGY-4
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
## 2024-09-11 NOTE — Progress Notes (Signed)
(  Sleep Hours) -12.5  (Any PRNs that were needed, meds refused, or side effects to meds)- Nicotine gum, Vistaril 25 mg , Trazodone 50 mg  (Any disturbances and when (visitation, over night)- none  (Concerns raised by the patient)- pt continues to be needy with med seeking behaviors  (SI/HI/AVH)-denies

## 2024-09-11 NOTE — Plan of Care (Signed)
   Problem: Education: Goal: Knowledge of [facility] Education information/materials will improve Outcome: Progressing Goal: Emotional status will improve Outcome: Progressing Goal: Mental status will improve Outcome: Progressing Goal: Verbalization of understanding the information provided will improve Outcome: Progressing

## 2024-09-11 NOTE — Progress Notes (Signed)
 Tour of Duty:  Prentice JINNY Angle, RN, 09/11/24, Tour of Duty: 0700-1900  SI/HI/AVH: Denies  Self-Reported   Mood: Negative  Anxiety: Endorses Depression: Denies Irritability: Denies  Broset  Violence Prevention Guidelines *See Row Information*: Moderate Violence Risk interventions implemented   LBM  Last BM Date : 09/11/24   Pain: present, PRN provided (see MAR)  Patient Refusals (including Rx): No  >>Shift Summary:Patient observed to be severely anxious on unit. Patient able to make needs known. Patient frequently seeks staff and PRN medication, struggles to accept no and wait answers. Patient observed to engage inappropriately with staff and peers, difficult to redirect. Patient taking medications as prescribed. This shift, many (7) PRN medications requested or required. No reported or observed side effects to medication. No reported or observed agitation, aggression, or other acute emotional distress. Patient continues to present with active withdrawal symptoms. No additional reported or observed physical abnormalities or concerns. COWS 11.   Last Vitals  Vitals Weight: 50.9 kg Temp: (!) 97.3 F (36.3 C) Temp Source: Oral Pulse Rate: 99 Resp: 14 BP: (!) 129/90 Patient Position: (not recorded)  Admission Type  Psych Admission Type (Psych Patients Only) Admission Status: Involuntary Date 72 hour document signed : (not recorded) Time 72 hour document signed : (not recorded) Provider Notified (First and Last Name) (see details for LINK to note): (not recorded)   Psychosocial Assessment  Psychosocial Assessment Patient Complaints: Anxiety, Malaise, Restlessness, Substance abuse Eye Contact: Brief Facial Expression: Anxious, Pinched Affect: Anxious, Labile, Preoccupied Speech: Rapid Interaction: Attention-seeking, Needy Motor Activity: Fidgety, Restless Appearance/Hygiene: Disheveled Behavior Characteristics: Anxious, Agitated, Fidgety, Hyperactive, Impulsive,  Restless Mood: Anxious, Preoccupied   Aggressive Behavior  Targets: (not recorded)   Thought Process  Thought Process Coherency: Circumstantial Content: Preoccupation Delusions: None reported or observed Perception: Within Defined Limits Hallucination: None reported or observed Judgment: Impaired Confusion: (not recorded)  Danger to Self/Others  Danger to Self Current suicidal ideation?: Denies Description of Suicide Plan: (not recorded) Self-Injurious Behavior: (not recorded) Agreement Not to Harm Self: (not recorded) Description of Agreement: (not recorded) Danger to Others: None reported or observed

## 2024-09-11 NOTE — Group Note (Signed)
 Date:  09/11/2024 Time:  8:27 PM  Group Topic/Focus:  Wrap-Up Group:   The focus of this group is to help patients review their daily goal of treatment and discuss progress on daily workbooks.    Participation Level:  Active  Participation Quality:  Appropriate and Sharing  Affect:  Appropriate and Flat  Cognitive:  Appropriate  Insight: Appropriate and Limited  Engagement in Group:  Engaged  Modes of Intervention:  Discussion and Socialization  Additional Comments:  Patient shared that she had an alright day. Patient shared that she watched a movie today and went outside and to the gym. Patient rated her day a 8 out of 10. Patient shared that she has been experiencing some constipation. Patient  shared her goal, to maintain positive mindset, no negativity.  Eward Mace 09/11/2024, 8:27 PM

## 2024-09-11 NOTE — BH Assessment (Signed)
(  Sleep Hours) - 8.5 (Any PRNs that were needed, meds refused, or side effects to meds)-  (Any disturbances and when (visitation, over night)- (Concerns raised by the patient)- Med seeking continuously asking for meds. No noted elevated COWS. (SI/HI/AVH)- Denies

## 2024-09-11 NOTE — [facility] Suicide Risk Assessment (Addendum)
 [facility] INPATIENT:  Family/Significant Other Suicide Prevention Education  Suicide Prevention Education:  Contact Attempts: Elaynah Virginia (sister) [phone], (name of family member/significant other) has been identified by the patient as the family member/significant other with whom the patient will be residing, and identified as the person(s) who will aid the patient in the event of a mental health crisis.  With written consent from the patient, two attempts were made to provide suicide prevention education, prior to and/or following the patient's discharge.  We were unsuccessful in providing suicide prevention education.  A suicide education pamphlet was given to the patient to share with family/significant other.  Date and time of first attempt:09/11/2024 / 2:52 PM  CSW left a voicemail.  Date and time of second attempt:  second attempt needed   Malli Falotico O Peachie Barkalow, LCSWA 09/11/2024, 2:51 PM

## 2024-09-11 NOTE — Plan of Care (Signed)
  Problem: Education: Goal: Emotional status will improve Outcome: Progressing Goal: Mental status will improve Outcome: Progressing   Problem: Activity: Goal: Interest or engagement in activities will improve Outcome: Progressing Goal: Sleeping patterns will improve Outcome: Progressing   Problem: Safety: Goal: Ability to remain free from injury will improve Outcome: Progressing

## 2024-09-11 NOTE — Group Note (Signed)
 Recreation Therapy Group Note   Group Topic:Self-Esteem  Group Date: 09/11/2024 Start Time: 1000 End Time: 1030 Facilitators: Prentiss Hammett-McCall, LRT,CTRS Location: [address] Dayroom   Group Topic: Self Esteem    Goal Area(s) Addresses:  Patient will appropriately identify what self esteem is.  Patient will create a shield of armor describing themselves.  Patient will successfully identify positive attributes about themselves.  Patient will acknowledge benefit of improved self-esteem.   Behavioral Response:   Intervention / Activity: Self-Esteem Shield. Patient attended a recreation therapy group session focused on self esteem. Patient identified what self esteem is, and why it is important to have high self esteem during group discussion. LRT wrote on the white board, drawing the outline of the shield and labeling the quadrants.  Patient was asked to create their own shield to show off their unique attributes, four quadrants reflected the following:  The Upper Left quadrant- reasons they are unique/special. The Upper Right quadrant- things that they love to do. The Lower Left quadrant- goals for their future. The Lower Right quadrant- character words that describe them.    Patients were provided sheets with the shield printed on them and colored pencils, markers and crayons to complete the activity.  Patients and writer had group related discussions while individually working on their activity.  Patients were debriefed on the importance of healthy self esteem and offered a handout for ways to increase self esteem.   Education: Self esteem, Communication, Positive self-talk, Discharge Planning   Education Outcome: Acknowledges education/Verbalizes understanding of Education/In group clarification offered/Needs further education    Clinical Observations/Individualized Feedback: Recreation therapy group was to occur at this time. Due to lack of staffing, LRT was assisting with  the dayroom on [address] hall.     Plan: Continue to engage patient in RT group sessions 2-3x/week.   Mayer Vondrak-McCall, LRT,CTRS 09/11/2024 12:11 PM

## 2024-09-11 NOTE — Group Note (Signed)
 Date:  09/10/2024 Time: 2000  Group Topic/Focus:  Understanding Mental Health using Ask Me 3    Participation Level:  Active  Participation Quality:  Appropriate  Affect:  Appropriate  Cognitive:  Appropriate  Insight: Appropriate  Engagement in Group:  Engaged  Modes of Intervention:  Discussion and Education  Additional Comments:  Pt actively engaged during group.   Kelly [name] 09/11/2024, 2:52 AM

## 2024-09-11 NOTE — Progress Notes (Signed)
 Conversation with patient:  Patient said that she is not interested in inpatient substance use services, however would go to substance use outpatient treatment program.    Ruben Pyka, LCSWA 09/11/2024

## 2024-09-11 NOTE — Plan of Care (Signed)
   Problem: Education: Goal: Emotional status will improve Outcome: Not Progressing Goal: Mental status will improve Outcome: Not Progressing

## 2024-09-11 NOTE — BH IP Treatment Plan (Signed)
 Interdisciplinary Treatment and Diagnostic Plan Update  09/11/2024 Time of Session: 10:20AM Kelly [name] MRN: [id number]  Principal Diagnosis: Substance induced mood disorder (HCC)  Secondary Diagnoses: Principal Problem:   Substance induced mood disorder (HCC) Active Problems:   Marijuana abuse   Benzodiazepine abuse (HCC)   Opioid use disorder, severe, dependence (HCC)   Cocaine use disorder, moderate, dependence (HCC)   Polysubstance abuse (HCC)   Current Medications:  Current Facility-Administered Medications  Medication Dose Route Frequency Provider Last Rate Last Admin   acetaminophen  (TYLENOL ) tablet 650 mg  650 mg Oral Q6H PRN Tex Drilling, NP   650 mg at 09/11/24 1006   alum & mag hydroxide-simeth (MAALOX/MYLANTA) 200-200-20 MG/5ML suspension 30 mL  30 mL Oral Q4H PRN Tex Drilling, NP       ARIPiprazole  (ABILIFY ) tablet 2 mg  2 mg Oral Daily Chandra Charleston Christian Lee, DO   2 mg at 09/11/24 9196   buprenorphine-naloxone (SUBOXONE) 2-0.5 mg per SL tablet 2 tablet  2 tablet Sublingual BID Chandra Charleston Sherlean Ruth, DO   2 tablet at 09/11/24 9196   cloNIDine (CATAPRES) tablet 0.1 mg  0.1 mg Oral QID Nkwenti, Doris, NP   0.1 mg at 09/11/24 9196   Followed by   cloNIDine (CATAPRES) tablet 0.1 mg  0.1 mg Oral BH-qamhs Nkwenti, Doris, NP       Followed by   NOREEN ON 09/14/2024] cloNIDine (CATAPRES) tablet 0.1 mg  0.1 mg Oral QAC breakfast Tex Drilling, NP       dicyclomine (BENTYL) tablet 20 mg  20 mg Oral Q6H PRN Tex Drilling, NP   20 mg at 09/10/24 1503   haloperidol (HALDOL) tablet 5 mg  5 mg Oral TID PRN Tex Drilling, NP       And   diphenhydrAMINE  (BENADRYL ) capsule 50 mg  50 mg Oral TID PRN Tex Drilling, NP       haloperidol lactate (HALDOL) injection 10 mg  10 mg Intramuscular TID PRN Tex Drilling, NP       And   diphenhydrAMINE  (BENADRYL ) injection 50 mg  50 mg Intramuscular TID PRN Tex Drilling, NP       And   LORazepam  (ATIVAN )  injection 2 mg  2 mg Intramuscular TID PRN Tex Drilling, NP       haloperidol lactate (HALDOL) injection 5 mg  5 mg Intramuscular TID PRN Tex Drilling, NP       And   diphenhydrAMINE  (BENADRYL ) injection 50 mg  50 mg Intramuscular TID PRN Tex Drilling, NP       And   LORazepam  (ATIVAN ) injection 2 mg  2 mg Intramuscular TID PRN Tex Drilling, NP       feeding supplement (ENSURE PLUS HIGH PROTEIN) liquid 237 mL  237 mL Oral BID BM Pashayan, Alexander S, DO   237 mL at 09/11/24 1007   hydrOXYzine (ATARAX) tablet 25 mg  25 mg Oral Q6H PRN Tex Drilling, NP   25 mg at 09/11/24 1006   loperamide (IMODIUM) capsule 2-4 mg  2-4 mg Oral PRN Tex Drilling, NP       magnesium  hydroxide (MILK OF MAGNESIA) suspension 30 mL  30 mL Oral Daily PRN Tex Drilling, NP       methocarbamol (ROBAXIN) tablet 500 mg  500 mg Oral Q8H PRN Tex Drilling, NP   500 mg at 09/10/24 0838   metoprolol succinate (TOPROL-XL) 24 hr tablet 12.5 mg  12.5 mg Oral Daily Chandra Charleston Christian Lee, DO   12.5 mg  at 09/11/24 0803   naproxen  (NAPROSYN ) tablet 500 mg  500 mg Oral BID PRN Tex Drilling, NP   500 mg at 09/11/24 1006   nicotine polacrilex (NICORETTE) gum 2 mg  2 mg Oral PRN Pashayan, Alexander S, DO   2 mg at 09/11/24 1006   ondansetron  (ZOFRAN -ODT) disintegrating tablet 4 mg  4 mg Oral Q6H PRN Tex Drilling, NP   4 mg at 09/11/24 0412   traZODone (DESYREL) tablet 50 mg  50 mg Oral QHS PRN Tex Drilling, NP   50 mg at 09/10/24 2104   PTA Medications: Medications Prior to Admission  Medication Sig Dispense Refill Last Dose/Taking   ipratropium (ATROVENT ) 0.03 % nasal spray Place 2 sprays into both nostrils 2 (two) times daily as needed for rhinitis. (Patient not taking: Reported on 09/06/2024) 30 mL 0     Patient Stressors: Financial difficulties   Occupational concerns   Substance abuse    Patient Strengths: Capable of independent living  Manufacturing systems engineer  Supportive family/friends   Treatment  Modalities: Medication Management, Group therapy, Case management,  1 to 1 session with clinician, Psychoeducation, Recreational therapy.   Physician Treatment Plan for Primary Diagnosis: Substance induced mood disorder (HCC) Long Term Goal(s): Improvement in symptoms so as ready for discharge   Short Term Goals: Ability to identify changes in lifestyle to reduce recurrence of condition will improve Ability to demonstrate self-control will improve Compliance with prescribed medications will improve Ability to identify triggers associated with substance abuse/mental health issues will improve Ability to identify and develop effective coping behaviors will improve  Medication Management: Evaluate patient's response, side effects, and tolerance of medication regimen.  Therapeutic Interventions: 1 to 1 sessions, Unit Group sessions and Medication administration.  Evaluation of Outcomes: Not Progressing  Physician Treatment Plan for Secondary Diagnosis: Principal Problem:   Substance induced mood disorder (HCC) Active Problems:   Marijuana abuse   Benzodiazepine abuse (HCC)   Opioid use disorder, severe, dependence (HCC)   Cocaine use disorder, moderate, dependence (HCC)   Polysubstance abuse (HCC)  Long Term Goal(s): Improvement in symptoms so as ready for discharge   Short Term Goals: Ability to identify changes in lifestyle to reduce recurrence of condition will improve Ability to demonstrate self-control will improve Compliance with prescribed medications will improve Ability to identify triggers associated with substance abuse/mental health issues will improve Ability to identify and develop effective coping behaviors will improve     Medication Management: Evaluate patient's response, side effects, and tolerance of medication regimen.  Therapeutic Interventions: 1 to 1 sessions, Unit Group sessions and Medication administration.  Evaluation of Outcomes: Not  Progressing   RN Treatment Plan for Primary Diagnosis: Substance induced mood disorder (HCC) Long Term Goal(s): Knowledge of disease and therapeutic regimen to maintain health will improve  Short Term Goals: Ability to remain free from injury will improve, Ability to verbalize frustration and anger appropriately will improve, Ability to demonstrate self-control, Ability to participate in decision making will improve, and Ability to verbalize feelings will improve  Medication Management: RN will administer medications as ordered by provider, will assess and evaluate patient's response and provide education to patient for prescribed medication. RN will report any adverse and/or side effects to prescribing provider.  Therapeutic Interventions: 1 on 1 counseling sessions, Psychoeducation, Medication administration, Evaluate responses to treatment, Monitor vital signs and CBGs as ordered, Perform/monitor CIWA, COWS, AIMS and Fall Risk screenings as ordered, Perform wound care treatments as ordered.  Evaluation of Outcomes: Not Progressing  LCSW Treatment Plan for Primary Diagnosis: Substance induced mood disorder (HCC) Long Term Goal(s): Safe transition to appropriate next level of care at discharge, Engage patient in therapeutic group addressing interpersonal concerns.  Short Term Goals: Engage patient in aftercare planning with referrals and resources, Increase social support, Increase ability to appropriately verbalize feelings, Increase emotional regulation, and Facilitate acceptance of mental health diagnosis and concerns  Therapeutic Interventions: Assess for all discharge needs, 1 to 1 time with Social worker, Explore available resources and support systems, Assess for adequacy in community support network, Educate family and significant other(s) on suicide prevention, Complete Psychosocial Assessment, Interpersonal group therapy.  Evaluation of Outcomes: Not Progressing   Progress in  Treatment: Attending groups: Yes. Participating in groups: Yes. Taking medication as prescribed: Yes. Toleration medication: Yes. Family/Significant other contact made: No, will contact:  family once consent is given Patient understands diagnosis: Yes. Discussing patient identified problems/goals with staff: Yes. Medical problems stabilized or resolved: Yes. Denies suicidal/homicidal ideation: Yes. Issues/concerns per patient self-inventory: No.  Patient Goals:  to maintain how I am right now, I just want to stay positive.  Discharge Plan or Barriers: Patient to discharge to sister's house once stable.   Reason for Continuation of Hospitalization: Depression Medication stabilization  Estimated Length of Stay: 7-10 days  Last 3 [place] Suicide Severity Risk Score: Flowsheet Row [facility] ([facility] Most recent reading at 09/09/2024  1:45 PM ED from 09/08/2024 in [facility] Most recent reading at 09/09/2024 12:35 AM ED from 09/08/2024 in [facility] Most recent reading at 09/08/2024  8:42 PM  C-SSRS RISK CATEGORY High Risk No Risk No Risk    Last PHQ 2/9 Scores:    09/09/2024   12:07 PM 04/06/2022   10:08 AM 01/21/2018    3:11 PM  Depression screen PHQ 2/9  Decreased Interest 1 0 0  Down, Depressed, Hopeless 1 1 0  PHQ - 2 Score 2 1 0  Altered sleeping 1  0  Tired, decreased energy 1  0  Change in appetite 1  0  Feeling bad or failure about yourself  1  0  Trouble concentrating 1  0  Moving slowly or fidgety/restless 1  0  Suicidal thoughts 1  0  PHQ-9 Score 9  0  Difficult doing work/chores Somewhat difficult      Scribe for Treatment Team: Konnar Ben M Leanard Dimaio, ISRAEL 09/11/2024 10:48 AM

## 2024-09-11 NOTE — [facility] Suicide Risk Assessment (Signed)
 [facility] INPATIENT:  Family/Significant Other Suicide Prevention Education  Suicide Prevention Education:  Education Completed; Rheanna Sergent (mom) [phone],  (name of family member/significant other) has been identified by the patient as the family member/significant other with whom the patient will be residing, and identified as the person(s) who will aid the patient in the event of a mental health crisis (suicidal ideations/suicide attempt).  With written consent from the patient, the family member/significant other has been provided the following suicide prevention education, prior to the and/or following the discharge of the patient.  Mom said that patient will stay with my oldest daughter, Geraline, if she continues taking Suboxone (mom said patient started taking it in the hospital), will not do drugs, and participate in outpatient treatment.  Mom said that she would like patient to go a 30-day program, but patient doesn't want to.  Mom said patient doesn't have access to guns or weapons, and there are no guns or weapons where she will go upon discharge.  Mom doesn't have safety concerns about patient being discharged, however felt that patient needs more time in the hospital.  I want her to get better.  Me and my daughter and her other sister advocate for her.  The suicide prevention education provided includes the following: Suicide risk factors Suicide prevention and interventions National Suicide Hotline telephone number [facility] assessment telephone number [facility] Emergency Assistance 911 [facility] and/or Residential Mobile Crisis Unit telephone number  Request made of family/significant other to: Remove weapons (e.g., guns, rifles, knives), all items previously/currently identified as safety concern.   Remove drugs/medications (over-the-counter, prescriptions, illicit drugs), all items previously/currently identified as a safety concern.  The family  member/significant other verbalizes understanding of the suicide prevention education information provided.  The family member/significant other agrees to remove the items of safety concern listed above.  Michal Strzelecki O Aleathea Pugmire, LCSWA 09/11/2024, 12:50 PM

## 2024-09-12 LAB — T.PALLIDUM AB, TOTAL: T Pallidum Abs: REACTIVE — AB

## 2024-09-12 LAB — LEAD, BLOOD (ADULT >= 16 YRS): Lead-Whole Blood: <1 ug/dL (ref 0.0–3.4)

## 2024-09-12 MED ORDER — GUAIFENESIN ER 600 MG PO TB12
1200.0000 mg | ORAL_TABLET | Freq: Two times a day (BID) | ORAL | Status: AC
  Administered 2024-09-12 – 2024-09-14 (×4): 1200 mg via ORAL
  Filled 2024-09-12 (×4): qty 2

## 2024-09-12 MED ORDER — BUPRENORPHINE HCL-NALOXONE HCL 8-2 MG SL SUBL
1.0000 | SUBLINGUAL_TABLET | Freq: Two times a day (BID) | SUBLINGUAL | Status: DC
  Administered 2024-09-12 – 2024-09-14 (×4): 1 via SUBLINGUAL
  Filled 2024-09-12 (×4): qty 1

## 2024-09-12 NOTE — Progress Notes (Signed)
 Tour of Duty:  Prentice JINNY Angle, RN, 09/12/24, Tour of Duty: 0700-1900  SI/HI/AVH: Denies  Self-Reported   Mood: Negative  Anxiety: Endorses Depression: Denies Irritability: Denies, but Observable  Broset  Violence Prevention Guidelines *See Row Information*: Small Violence Risk interventions implemented   LBM  Last BM Date : 09/11/24   Pain: present, PRN provided (see MAR)  Patient Refusals (including Rx): No  >>Shift Summary: Patient observed to be anxious and preoccupied on unit. Patient able to make needs known. Patient continues to report malaise and symptoms of withdrawal.  Patient observed to engage inappropriately with staff and peers, poor boundaries intrusive. Patient taking medications as prescribed. This shift, several PRN medications requested or required. No reported or observed side effects to medication. No reported or observed agitation, aggression, or other acute emotional distress. No additional reported or observed physical abnormalities or concerns. Patient moved to 400 hall.  Last Vitals  Vitals Weight: 50.9 kg Temp: 98 F (36.7 C) Temp Source: Oral Pulse Rate: 92 Resp: 18 BP: 108/72 Patient Position: (not recorded)  Admission Type  Psych Admission Type (Psych Patients Only) Admission Status: Involuntary Date 72 hour document signed : (not recorded) Time 72 hour document signed : (not recorded) Provider Notified (First and Last Name) (see details for LINK to note): (not recorded)   Psychosocial Assessment  Psychosocial Assessment Patient Complaints: Substance abuse, Malaise, Anxiety Eye Contact: Brief Facial Expression: Anxious, Pinched Affect: Anxious, Labile, Preoccupied Speech: Rapid Interaction: Attention-seeking, Needy Motor Activity: Fidgety, Restless Appearance/Hygiene: Disheveled Behavior Characteristics: Anxious, Restless Mood: Preoccupied   Aggressive Behavior  Targets: (not recorded)   Thought Process  Thought  Process Coherency: Circumstantial Content: Preoccupation Delusions: None reported or observed Perception: Within Defined Limits Hallucination: None reported or observed Judgment: Impaired Confusion: None  Danger to Self/Others  Danger to Self Current suicidal ideation?: Denies Description of Suicide Plan: (not recorded) Self-Injurious Behavior: (not recorded) Agreement Not to Harm Self: (not recorded) Description of Agreement: (not recorded) Danger to Others: None reported or observed

## 2024-09-12 NOTE — Group Note (Signed)
 Date:  09/12/2024 Time:  10:12 AM  Group Topic/Focus:  Goals Group:   The focus of this group is to help patients establish daily goals to achieve during treatment and discuss how the patient can incorporate goal setting into their daily lives to aide in recovery. Orientation:   The focus of this group is to educate the patient on the purpose and policies of crisis stabilization and provide a format to answer questions about their admission.  The group details unit policies and expectations of patients while admitted.    Participation Level:  Active  Participation Quality:  Attentive, Sharing, and Supportive  Affect:  Appropriate  Cognitive:  Appropriate  Insight: Appropriate  Engagement in Group:  Engaged  Modes of Intervention:  Confrontation and Orientation  Additional Comments:    Kelly [name] D Calogero Geisen 09/12/2024, 10:12 AM

## 2024-09-12 NOTE — Progress Notes (Signed)
(  Sleep Hours) -11.5  (Any PRNs that were needed, meds refused, or side effects to meds)- hydroxyzine 25mg , Trazodone 50mg  (administered once patient fully awake and alert)  (Any disturbances and when (visitation, over night)- none  (Concerns raised by the patient)- none  (SI/HI/AVH)-denies

## 2024-09-12 NOTE — Plan of Care (Signed)
   Problem: Education: Goal: Emotional status will improve Outcome: Not Progressing Goal: Mental status will improve Outcome: Not Progressing

## 2024-09-12 NOTE — [facility] Suicide Risk Assessment (Signed)
 [facility] INPATIENT:  Family/Significant Other Suicide Prevention Education  Suicide Prevention Education:  Education Completed; Kimela Malstrom (sister) [phone],  (name of family member/significant other) has been identified by the patient as the family member/significant other with whom the patient will be residing, and identified as the person(s) who will aid the patient in the event of a mental health crisis (suicidal ideations/suicide attempt).  With written consent from the patient, the family member/significant other has been provided the following suicide prevention education, prior to the and/or following the discharge of the patient.  Sister said that patient will go to her home, if she has a plan and is doing what she needs to do.  Sister said she can pick up patient upon discharge (she gets off at 4 PM).  Sister said they don't have any guns or weapons, and patient doesn't have access to guns or weapons.  Sister said that patient struggles with both, substance use and mental health.  She said mental health issues run in their family, and sister had similar struggles, but overcame them.  The suicide prevention education provided includes the following: Suicide risk factors Suicide prevention and interventions National Suicide Hotline telephone number [facility] assessment telephone number [facility] Emergency Assistance 911 [facility] and/or Residential Mobile Crisis Unit telephone number  Request made of family/significant other to: Remove weapons (e.g., guns, rifles, knives), all items previously/currently identified as safety concern.   Remove drugs/medications (over-the-counter, prescriptions, illicit drugs), all items previously/currently identified as a safety concern.  The family member/significant other verbalizes understanding of the suicide prevention education information provided.  The family member/significant other agrees to remove the items of  safety concern listed above.  Lynix Bonine O Yehoshua Vitelli, LCSWA 09/12/2024, 1:39 PM

## 2024-09-12 NOTE — Plan of Care (Signed)
   Problem: Education: Goal: Emotional status will improve Outcome: Progressing Goal: Verbalization of understanding the information provided will improve Outcome: Progressing

## 2024-09-12 NOTE — Plan of Care (Signed)
   Problem: Education: Goal: Mental status will improve Outcome: Progressing   Problem: Activity: Goal: Interest or engagement in activities will improve Outcome: Progressing

## 2024-09-12 NOTE — Group Note (Signed)
 Recreation Therapy Group Note   Group Topic:Healthy Decision Making  Group Date: 09/12/2024 Start Time: 1030 End Time: 1100 Facilitators: Kalei Meda-McCall, LRT,CTRS Location: 500 Hall Dayroom   Group Topic: Decision Making, Problem Solving, Communication   Goal Area(s) Addresses:  Patient will effectively work with peer towards shared goal.  Patient will identify factors that guided their decision making.  Patient will pro-socially communicate ideas during group session.    Behavioral Response: Engaged   Intervention: Survival Scenario - pencil, paper   Activity: Patients were given a scenario that they were going to be stranded on a deserted [place] for several months before being rescued. Writer tasked them with making a list of 15 things they would choose to bring with them for survival. The list of items was prioritized most important to least. Each patient would come up with their own list, then work together to create a new list of 15 items while in a group of 3-5 peers. LRT discussed each person's list and how it differed from others. The debrief included discussion of priorities, good decisions versus bad decisions, and how it is important to think before acting so we can make the best decision possible. LRT tied the concept of effective communication among group members to patient's support systems outside of the hospital and its benefit post discharge.   Education: Pharmacist, community, Priorities, Support System, Discharge Planning    Education Outcome: Acknowledges education/In group clarification/Needs additional education   Affect/Mood: Appropriate   Participation Level: Engaged   Participation Quality: Independent   Behavior: Appropriate   Speech/Thought Process: Focused   Insight: Good   Judgement: Good   Modes of Intervention: Group work   Patient Response to Interventions:  Engaged   Education Outcome:  In group clarification offered    Clinical  Observations/Individualized Feedback: Pt was bright and engaged during group. Pt stated decisions determine how your day will go. Pt was attentive and identified some of the items she would take with her are her kids, food, water , camera and hygiene kit. Pt worked well with peers in creating their list. Pt would prompt peers for ideas and was respectful during activity.     Plan: Continue to engage patient in RT group sessions 2-3x/week.   Kelly [name], LRT,CTRS 09/12/2024 1:15 PM

## 2024-09-12 NOTE — Group Note (Signed)
 Date:  09/12/2024 Time:  3:56 PM  Group Topic/Focus:  Boundaries: The focus of this group is to discuss five boundary types and describes what healthy, porous, and rigid boundaries look like for each. This encourages clients to reflect on their boundaries, understand how they differ, and identify strengths and weaknesses.    Participation Level:  Did Not Attend   Huel Mall 09/12/2024, 3:56 PM

## 2024-09-12 NOTE — Progress Notes (Addendum)
 [facility] MD Progress Note  09/12/2024 10:33 AM Kelly [name]  MRN:  [id number]  Principal Problem: Opioid use disorder, severe, dependence (HCC) Diagnosis: Principal Problem:   Opioid use disorder, severe, dependence (HCC) Active Problems:   Marijuana abuse   Benzodiazepine abuse (HCC)   Cocaine use disorder, moderate, dependence (HCC)   Polysubstance abuse (HCC)   Substance induced mood disorder (HCC)    Reason for Admission:  The patient is a 23 year old female with a prior history of 1 psychiatric hospitalization as an adolescent for DMDD.  She presented under involuntary commitment, paperwork filled out by her sister, who reported increasing substance use and concern for suicidal behavior.  She is currently admitted to the [facility] behavioral health hospital on an involuntary basis.   Information obtained from 24-hour nursing report: No significant events   Information Obtained Today During Patient Interview:  The patient begins the interview by asking about discharge.  She reports that she is doing well: Her mood is good, sleep and appetite are good, and that she is having no thoughts of self-harm.  She denies experiencing any symptoms of withdrawal.  She reports some benefit from the nighttime Remeron.  She says that she is optimistic about the future and wants to stay abstinent from opioids.  She says that she will go home with her sister [facility]  Called the patient's sister, Kelly [name], [phone].  No answer, left a voicemail.  Social work previously contacted the patient's mother, who stated the patient would go stay with her sister after discharge.  Addendum: LCSW talked to patient's sister.  She is coming to visit the patient tonight and wants to see how she is doing.  Patient reported opioid cravings to the attending psychiatrist.  Plan for increase in Suboxone.   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   GERD (gastroesophageal reflux disease)    Gonorrhea    Seizures (HCC)    one seizure August 2016   Vomiting     Past Surgical History:  Procedure Laterality Date   tubes in ears     Family History:  Family History  Problem Relation Age of Onset   Healthy Mother    Cancer Maternal Uncle    Family Psychiatric  History: as per H and P Social History:  Social History   Substance and Sexual Activity  Alcohol Use No     Social History   Substance and Sexual Activity  Drug Use Yes   Types: Marijuana   Comment: Occassionally    Social History   Socioeconomic History   Marital status: Single    Spouse name: Not on file   Number of children: Not on file   Years of education: Not on file   Highest education level: Not on file  Occupational History   Not on file  Tobacco Use   Smoking status: Every Day    Current packs/day: 0.00    Types: Cigarettes, E-cigarettes    Last attempt to quit: 05/25/2016    Years since quitting: 8.3   Smokeless tobacco: Never  Vaping Use   Vaping status: Never Used  Substance and Sexual Activity   Alcohol use: No  Drug use: Yes    Types: Marijuana    Comment: Occassionally   Sexual activity: Yes    Birth control/protection: None    Comment: Last encounter: Mid October 2024  Other Topics Concern   Not on file  Social History Narrative   Not on file   Social Drivers of Health   Financial Resource Strain: Low Risk  (04/06/2022)   Overall Financial Resource Strain (CARDIA)    Difficulty of Paying Living Expenses: Not very hard  Food Insecurity: Food Insecurity Present (09/09/2024)   Hunger Vital Sign    Worried About Running Out of Food in the Last Year: Sometimes true    Ran Out of  Food in the Last Year: Sometimes true  Transportation Needs: Unmet Transportation Needs (09/09/2024)   PRAPARE - Administrator, Civil Service (Medical): Yes    Lack of Transportation (Non-Medical): Yes  Physical Activity: Insufficiently Active (04/06/2022)   Exercise Vital Sign    Days of Exercise per Week: 3 days    Minutes of Exercise per Session: 20 min  Stress: Stress Concern Present (04/06/2022)   [name] - Occupational Stress Questionnaire    Feeling of Stress : To some extent  Social Connections: Socially Isolated (04/06/2022)   Social Connection and Isolation Panel    Frequency of Communication with Friends and Family: Once a week    Frequency of Social Gatherings with Friends and Family: Never    Attends Religious Services: Never    Diplomatic Services operational officer: No    Attends Engineer, structural: Never    Marital Status: Never married   Additional Social History:                         Sleep: fair  Appetite: fair  Current Medications: Current Facility-Administered Medications  Medication Dose Route Frequency Provider Last Rate Last Admin   acetaminophen  (TYLENOL ) tablet 650 mg  650 mg Oral Q6H PRN Tex Drilling, NP   650 mg at 09/11/24 2056   alum & mag hydroxide-simeth (MAALOX/MYLANTA) 200-200-20 MG/5ML suspension 30 mL  30 mL Oral Q4H PRN Tex Drilling, NP       ARIPiprazole  (ABILIFY ) tablet 5 mg  5 mg Oral Daily Marry Clamp, MD   5 mg at 09/12/24 0837   buprenorphine-naloxone (SUBOXONE) 2-0.5 mg per SL tablet 2 tablet  2 tablet Sublingual BID Chandra Charleston Sherlean Ruth, DO   2 tablet at 09/12/24 0837   cloNIDine (CATAPRES) tablet 0.1 mg  0.1 mg Oral BH-qamhs Nkwenti, Doris, NP   0.1 mg at 09/12/24 9162   Followed by   NOREEN ON 09/14/2024] cloNIDine (CATAPRES) tablet 0.1 mg  0.1 mg Oral QAC breakfast Tex Drilling, NP       dicyclomine (BENTYL) tablet 20 mg  20 mg Oral Q6H PRN Tex Drilling, NP   20 mg at 09/11/24 1444   haloperidol (HALDOL) tablet 5 mg  5 mg Oral TID PRN Tex Drilling, NP       And   diphenhydrAMINE  (BENADRYL ) capsule 50 mg  50 mg Oral TID PRN Tex Drilling, NP       haloperidol lactate (HALDOL) injection 10 mg  10 mg Intramuscular TID PRN Tex Drilling, NP       And   diphenhydrAMINE  (BENADRYL ) injection 50 mg  50 mg Intramuscular TID PRN Tex Drilling, NP       And   LORazepam  (ATIVAN ) injection 2 mg  2 mg Intramuscular TID PRN Tex Drilling, NP       haloperidol lactate (HALDOL) injection 5 mg  5 mg Intramuscular TID PRN Tex Drilling, NP       And   diphenhydrAMINE  (BENADRYL ) injection 50 mg  50 mg Intramuscular TID PRN Tex Drilling, NP       And   LORazepam  (ATIVAN ) injection 2 mg  2 mg Intramuscular TID PRN Tex Drilling, NP       feeding supplement (ENSURE PLUS HIGH PROTEIN) liquid 237 mL  237 mL Oral BID BM Pashayan, Alexander S, DO   237 mL at 09/12/24 0840   hydrOXYzine (ATARAX) tablet 25 mg  25 mg Oral Q6H PRN Tex Drilling, NP   25 mg at 09/12/24 0610   loperamide (IMODIUM) capsule 2-4 mg  2-4 mg Oral PRN Tex Drilling, NP       magnesium  hydroxide (MILK OF MAGNESIA) suspension 30 mL  30 mL Oral Daily PRN Tex Drilling, NP   30 mL at 09/11/24 1201   methocarbamol (ROBAXIN) tablet 500 mg  500 mg Oral Q8H PRN Tex Drilling, NP   500 mg at 09/11/24 1444   mirtazapine (REMERON) tablet 15 mg  15 mg Oral QHS Marry Clamp, MD   15 mg at 09/11/24 2033   naproxen  (NAPROSYN ) tablet 500 mg  500 mg Oral BID PRN Tex Drilling, NP   500 mg at 09/12/24 0839   nicotine polacrilex (NICORETTE) gum 2 mg  2 mg Oral PRN Pashayan, Alexander S, DO   2 mg at 09/12/24 9147   ondansetron  (ZOFRAN -ODT) disintegrating tablet 4 mg  4 mg Oral Q6H PRN Tex Drilling, NP   4 mg at 09/12/24 0609   traZODone (DESYREL) tablet 50 mg  50 mg Oral QHS PRN Tex Drilling, NP   50 mg at 09/11/24 2033    Lab Results:  No results found for this or any previous visit  (from the past 48 hours).   Blood Alcohol level:  Lab Results  Component Value Date   [facility] <15 09/06/2024   ETH <5 11/11/2015    Metabolic Disorder Labs: Lab Results  Component Value Date   HGBA1C 4.6 (L) 09/10/2024   MPG 85.32 09/10/2024   No results found for: PROLACTIN Lab Results  Component Value Date   CHOL 150 09/10/2024   TRIG 92 09/10/2024   HDL 42 09/10/2024   CHOLHDL 3.6 09/10/2024   VLDL 18 09/10/2024   LDLCALC 90 09/10/2024    Physical Findings:   Psychiatric Specialty Exam: Physical Exam Constitutional:      Appearance: the patient is not toxic-appearing.  Pulmonary:     Effort: Pulmonary effort is normal.  Neurological:     General: No focal deficit present.     Mental Status: the patient is alert and oriented to person, place, and time.   Review of Systems  Respiratory:  Negative for shortness of breath.   Cardiovascular:  Negative for chest pain.  Gastrointestinal:  Negative for abdominal pain, constipation, diarrhea, nausea and vomiting.  Neurological:  Negative for headaches.      BP (!) 132/93 (BP Location: Right Arm)   Pulse (!) 103   Temp 98 F (36.7 C) (Oral)   Resp 16   Ht 5' 6 (1.676 m)   Wt 50.9 kg   SpO2 100%   BMI 18.11 kg/m   General Appearance: Fairly Groomed  Eye Contact:  Good  Speech:  Clear and Coherent  Volume:  Normal  Mood:  Euthymic  Affect:  Congruent  Thought Process:  Coherent  Orientation:  Full (Time, Place, and Person)  Thought Content: Logical   Suicidal Thoughts:  No  Homicidal Thoughts:  No  Memory:  Immediate;   Good  Judgement:  fair  Insight:  fair  Psychomotor Activity:  Normal  Concentration:  Concentration: Good  Recall:  Good  Fund of Knowledge: Good  Language: Good  Akathisia:  No  Handed:  not assessed  AIMS (if indicated): not done  Assets:  Communication Skills Desire for Improvement Financial Resources/Insurance Housing Leisure Time Physical Health  ADL's:  Intact   Cognition: WNL  Sleep:  Fair      Treatment Plan Summary: Daily contact with patient to assess and evaluate symptoms and progress in treatment and Medication management  Opioid use disorder - COWS with clonidine taper - Increase Suboxone from 4 mg twice daily to 8 mg twice daily - Scheduled [facility], Suboxone prescriber, Friday 10/17  Substance-induced mood disorder - Continue Abilify  5 mg daily - Continue Remeron 15 mg nightly  Medical: - Extensive workup ordered, reportedly for first episode psychosis (but psychosis symptoms do not appear well substantiated in my review of the chart) - RPR positive with patient denying prior history of syphilis, T. pallidum antibody pending, will consult ID - ANA positive with slightly high RNP, can follow-up outpatient - Discontinue metoprolol for tachycardia, patient is already on clonidine taper    Karleen Kaufmann, MD PGY-4

## 2024-09-13 MED ORDER — DOXYCYCLINE HYCLATE 100 MG PO TABS
100.0000 mg | ORAL_TABLET | Freq: Two times a day (BID) | ORAL | Status: DC
  Administered 2024-09-13 – 2024-09-14 (×2): 100 mg via ORAL
  Filled 2024-09-13 (×2): qty 1

## 2024-09-13 NOTE — Group Note (Signed)
 Recreation Therapy Group Note   Group Topic:Problem Solving  Group Date: 09/13/2024 Start Time: 0940 End Time: 1010 Facilitators: Maykel Reitter-McCall, LRT,CTRS Location: [address] Dayroom   Group Topic: Communication, Team Building, Problem Solving   Goal Area(s) Addresses:  Patient will effectively work with peer towards shared goal.  Patient will identify skills used to make activity successful.  Patient will identify how skills used during activity can be used to reach post d/c goals.    Behavioral Response:    Intervention: STEM Activity   Activity: Landing Pad. In teams of 3-5, patients were given 12 plastic drinking straws and an equal length of masking tape. Using the materials provided, patients were asked to build a landing pad to catch a golf ball dropped from approximately 5 feet in the air. All materials were required to be used by the team in their design. LRT facilitated post-activity discussion.   Education: Pharmacist, community, Scientist, physiological, Discharge Planning    Education Outcome: Acknowledges education/In group clarification offered/Needs additional education.    Affect/Mood: N/A   Participation Level: Did not attend    Clinical Observations/Individualized Feedback:      Plan: Continue to engage patient in RT group sessions 2-3x/week.   Ayanni Tun-McCall, LRT,CTRS 09/13/2024 11:57 AM

## 2024-09-13 NOTE — Progress Notes (Signed)
 [facility] MD Progress Note  09/13/2024 9:46 AM Kelly [name]  MRN:  [id number]  Principal Problem: Opioid use disorder, severe, dependence (HCC) Diagnosis: Principal Problem:   Opioid use disorder, severe, dependence (HCC) Active Problems:   Marijuana abuse   Benzodiazepine abuse (HCC)   Cocaine use disorder, moderate, dependence (HCC)   Polysubstance abuse (HCC)   Substance induced mood disorder (HCC)   Reason for Admission:  The patient is a 23 year old female with a prior history of 1 psychiatric hospitalization as an adolescent for DMDD.  She presented under involuntary commitment, paperwork filled out by her sister, who reported increasing substance use and concern for suicidal behavior.  She is currently admitted to the [facility] behavioral health hospital on an involuntary basis.   Information obtained from 24-hour nursing report: No significant events   Information Obtained Today During Patient Interview:  Today the patient reports she is doing well.  She reports good mood, sleep, and appetite.  She says she is doing well with Suboxone at 8 mg twice a day.  She denies experiencing thoughts of self-harm or suicide.  We discussed her plan for abstinence after discharge.  Patient's mom is planning to pick up the patient tomorrow per LCSW.    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   GERD (gastroesophageal reflux disease)    Gonorrhea    Seizures (HCC)    one seizure August 2016   Vomiting     Past Surgical History:  Procedure Laterality Date   tubes in ears     Family History:  Family History  Problem Relation Age of Onset   Healthy Mother    Cancer Maternal Uncle    Family  Psychiatric  History: as per H and P Social History:  Social History   Substance and Sexual Activity  Alcohol Use No     Social History   Substance and Sexual Activity  Drug Use Yes   Types: Marijuana   Comment: Occassionally    Social History   Socioeconomic History   Marital status: Single    Spouse name: Not on file   Number of children: Not on file   Years of education: Not on file   Highest education level: Not on file  Occupational History   Not on file  Tobacco Use   Smoking status: Every Day    Current packs/day: 0.00    Types: Cigarettes, E-cigarettes    Last attempt to quit: 05/25/2016    Years since quitting: 8.3   Smokeless tobacco: Never  Vaping Use   Vaping status: Never Used  Substance and Sexual Activity   Alcohol use: No   Drug use: Yes    Types: Marijuana    Comment: Occassionally   Sexual activity: Yes    Birth control/protection: None    Comment: Last encounter: Mid October 2024  Other Topics Concern   Not on file  Social History Narrative   Not on file   Social Drivers of Health   Financial Resource Strain: Low Risk  (04/06/2022)   Overall Financial Resource Strain (CARDIA)    Difficulty of Paying Living Expenses: Not very hard  Food Insecurity: Food Insecurity Present (09/09/2024)   Hunger Vital Sign  Worried About Programme researcher, broadcasting/film/video in the Last Year: Sometimes true    Ran Out of Food in the Last Year: Sometimes true  Transportation Needs: Unmet Transportation Needs (09/09/2024)   PRAPARE - Administrator, Civil Service (Medical): Yes    Lack of Transportation (Non-Medical): Yes  Physical Activity: Insufficiently Active (04/06/2022)   Exercise Vital Sign    Days of Exercise per Week: 3 days    Minutes of Exercise per Session: 20 min  Stress: Stress Concern Present (04/06/2022)   [employer] of Occupational Health - Occupational Stress Questionnaire    Feeling of Stress : To some extent  Social Connections: Socially  Isolated (04/06/2022)   Social Connection and Isolation Panel    Frequency of Communication with Friends and Family: Once a week    Frequency of Social Gatherings with Friends and Family: Never    Attends Religious Services: Never    Diplomatic Services operational officer: No    Attends Engineer, structural: Never    Marital Status: Never married   Additional Social History:                         Sleep: fair  Appetite: fair  Current Medications: Current Facility-Administered Medications  Medication Dose Route Frequency Provider Last Rate Last Admin   acetaminophen  (TYLENOL ) tablet 650 mg  650 mg Oral Q6H PRN Tex Drilling, NP   650 mg at 09/11/24 2056   alum & mag hydroxide-simeth (MAALOX/MYLANTA) 200-200-20 MG/5ML suspension 30 mL  30 mL Oral Q4H PRN Tex Drilling, NP       ARIPiprazole  (ABILIFY ) tablet 5 mg  5 mg Oral Daily Marry Clamp, MD   5 mg at 09/13/24 0841   buprenorphine-naloxone (SUBOXONE) 8-2 mg per SL tablet 1 tablet  1 tablet Sublingual BID Marry Clamp, MD   1 tablet at 09/13/24 0840   [START ON 09/14/2024] cloNIDine (CATAPRES) tablet 0.1 mg  0.1 mg Oral QAC breakfast Tex Drilling, NP       dicyclomine (BENTYL) tablet 20 mg  20 mg Oral Q6H PRN Tex Drilling, NP   20 mg at 09/12/24 1117   haloperidol (HALDOL) tablet 5 mg  5 mg Oral TID PRN Tex Drilling, NP   5 mg at 09/12/24 1117   And   diphenhydrAMINE  (BENADRYL ) capsule 50 mg  50 mg Oral TID PRN Tex Drilling, NP   50 mg at 09/13/24 0841   haloperidol lactate (HALDOL) injection 10 mg  10 mg Intramuscular TID PRN Tex Drilling, NP       And   diphenhydrAMINE  (BENADRYL ) injection 50 mg  50 mg Intramuscular TID PRN Tex Drilling, NP       And   LORazepam  (ATIVAN ) injection 2 mg  2 mg Intramuscular TID PRN Tex Drilling, NP       haloperidol lactate (HALDOL) injection 5 mg  5 mg Intramuscular TID PRN Tex Drilling, NP       And   diphenhydrAMINE  (BENADRYL ) injection 50 mg  50 mg  Intramuscular TID PRN Tex Drilling, NP       And   LORazepam  (ATIVAN ) injection 2 mg  2 mg Intramuscular TID PRN Tex Drilling, NP       feeding supplement (ENSURE PLUS HIGH PROTEIN) liquid 237 mL  237 mL Oral BID BM Pashayan, Alexander S, DO   237 mL at 09/13/24 0841   guaiFENesin (MUCINEX) 12 hr tablet 1,200 mg  1,200 mg Oral  BID Marry Clamp, MD   1,200 mg at 09/13/24 9158   hydrOXYzine (ATARAX) tablet 25 mg  25 mg Oral Q6H PRN Tex Drilling, NP   25 mg at 09/12/24 2310   loperamide (IMODIUM) capsule 2-4 mg  2-4 mg Oral PRN Tex Drilling, NP       magnesium  hydroxide (MILK OF MAGNESIA) suspension 30 mL  30 mL Oral Daily PRN Tex Drilling, NP   30 mL at 09/11/24 1201   methocarbamol (ROBAXIN) tablet 500 mg  500 mg Oral Q8H PRN Tex Drilling, NP   500 mg at 09/11/24 1444   mirtazapine (REMERON) tablet 15 mg  15 mg Oral QHS Marry Clamp, MD   15 mg at 09/12/24 2310   naproxen  (NAPROSYN ) tablet 500 mg  500 mg Oral BID PRN Tex Drilling, NP   500 mg at 09/12/24 9160   nicotine polacrilex (NICORETTE) gum 2 mg  2 mg Oral PRN Pashayan, Alexander S, DO   2 mg at 09/13/24 9158   ondansetron  (ZOFRAN -ODT) disintegrating tablet 4 mg  4 mg Oral Q6H PRN Tex Drilling, NP   4 mg at 09/12/24 0609   traZODone (DESYREL) tablet 50 mg  50 mg Oral QHS PRN Tex Drilling, NP   50 mg at 09/12/24 2310    Lab Results:  No results found for this or any previous visit (from the past 48 hours).   Blood Alcohol level:  Lab Results  Component Value Date   [facility] <15 09/06/2024   ETH <5 11/11/2015    Metabolic Disorder Labs: Lab Results  Component Value Date   HGBA1C 4.6 (L) 09/10/2024   MPG 85.32 09/10/2024   No results found for: PROLACTIN Lab Results  Component Value Date   CHOL 150 09/10/2024   TRIG 92 09/10/2024   HDL 42 09/10/2024   CHOLHDL 3.6 09/10/2024   VLDL 18 09/10/2024   LDLCALC 90 09/10/2024    Physical Findings:   Psychiatric Specialty Exam: Physical  Exam Constitutional:      Appearance: the patient is not toxic-appearing.  Pulmonary:     Effort: Pulmonary effort is normal.  Neurological:     General: No focal deficit present.     Mental Status: the patient is alert and oriented to person, place, and time.   Review of Systems  Respiratory:  Negative for shortness of breath.   Cardiovascular:  Negative for chest pain.  Gastrointestinal:  Negative for abdominal pain, constipation, diarrhea, nausea and vomiting.  Neurological:  Negative for headaches.      BP (!) 120/90 (BP Location: Right Arm)   Pulse (!) 119   Temp 98.4 F (36.9 C) (Oral)   Resp 18   Ht 5' 6 (1.676 m)   Wt 50.9 kg   SpO2 98%   BMI 18.11 kg/m   General Appearance: Fairly Groomed  Eye Contact:  Good  Speech:  Clear and Coherent  Volume:  Normal  Mood:  Euthymic  Affect:  Congruent  Thought Process:  Coherent  Orientation:  Full (Time, Place, and Person)  Thought Content: Logical   Suicidal Thoughts:  No  Homicidal Thoughts:  No  Memory:  Immediate;   Good  Judgement:  fair  Insight:  fair  Psychomotor Activity:  Normal  Concentration:  Concentration: Good  Recall:  Good  Fund of Knowledge: Good  Language: Good  Akathisia:  No  Handed:  not assessed  AIMS (if indicated): not done  Assets:  Communication Skills Desire for Improvement Financial Resources/Insurance Housing Leisure  Time Physical Health  ADL's:  Intact  Cognition: WNL  Sleep:  Fair      Treatment Plan Summary: Daily contact with patient to assess and evaluate symptoms and progress in treatment and Medication management  Opioid use disorder - COWS with clonidine taper - Continue Suboxone 8 mg twice daily - Scheduled for [facility], Suboxone prescriber, Friday 10/17  Substance-induced mood disorder - Continue Abilify  5 mg daily - Continue Remeron 15 mg nightly  Medical: - Extensive workup ordered, reportedly for first episode psychosis (but  psychosis symptoms do not appear well substantiated in my review of the chart) - RPR positive with titer of 1:8, T. pallidum antibody positive - Consulted with Dr. Luiz, infectious disease on-call; she recommends treatment with 28 days doxycycline  due to penicillin  shortage - ID clinic to schedule follow-up - ANA positive with slightly high RNP, can follow-up outpatient - Discontinue metoprolol for tachycardia, patient is already on clonidine taper    Karleen Kaufmann, MD PGY-4

## 2024-09-13 NOTE — [facility] Group Notes (Signed)
 Kelly [name] did not attend wrap up group.

## 2024-09-13 NOTE — Plan of Care (Signed)
  Problem: Education: Goal: Knowledge of [place] [facility] Education information/materials will improve Outcome: Progressing Goal: Emotional status will improve Outcome: Progressing   Problem: Education: Goal: Mental status will improve Outcome: Not Progressing

## 2024-09-13 NOTE — Progress Notes (Signed)
 Collateral contact   Renne Cornick (sister) [phone]   Sister said that mom will pick up patient tomorrow, Thursday, 10/16.   Yoselin Amerman (mom) [phone]   Mom said she will pick up patient tomorrow, Thursday, 10/16, at 10:30 AM.   Zoejane Gaulin, LCSWA 09/13/2024

## 2024-09-13 NOTE — Progress Notes (Signed)

## 2024-09-13 NOTE — Plan of Care (Signed)
   Problem: Education: Goal: Knowledge of [place] [facility] Education information/materials will improve Outcome: Progressing Goal: Emotional status will improve Outcome: Progressing Goal: Mental status will improve Outcome: Progressing

## 2024-09-13 NOTE — Group Note (Signed)
 Date:  09/13/2024 Time:  4:19 PM  Group Topic/Focus:  Personal Choices and Values:   The focus of this group is to help patients assess and explore the importance of values in their lives, how their values affect their decisions, how they express their values and what opposes their expression.    Participation Level:  Did Not Attend   Huel Mall 09/13/2024, 4:19 PM

## 2024-09-14 ENCOUNTER — Other Ambulatory Visit (HOSPITAL_COMMUNITY): Payer: Self-pay

## 2024-09-14 ENCOUNTER — Other Ambulatory Visit: Payer: Self-pay

## 2024-09-14 ENCOUNTER — Ambulatory Visit (HOSPITAL_COMMUNITY): Payer: Self-pay

## 2024-09-14 DIAGNOSIS — F1120 Opioid dependence, uncomplicated: Secondary | ICD-10-CM

## 2024-09-14 DIAGNOSIS — F1914 Other psychoactive substance abuse with psychoactive substance-induced mood disorder: Secondary | ICD-10-CM

## 2024-09-14 DIAGNOSIS — F1510 Other stimulant abuse, uncomplicated: Secondary | ICD-10-CM

## 2024-09-14 DIAGNOSIS — F1420 Cocaine dependence, uncomplicated: Secondary | ICD-10-CM

## 2024-09-14 DIAGNOSIS — F1210 Cannabis abuse, uncomplicated: Secondary | ICD-10-CM

## 2024-09-14 LAB — HEAVY METALS, BLOOD
Arsenic: 2 ug/L (ref 0–9)
Lead: <1 ug/dL (ref 0.0–3.4)
Mercury: <1 ug/L (ref 0.0–14.9)

## 2024-09-14 MED ORDER — ARIPIPRAZOLE 5 MG PO TABS
5.0000 mg | ORAL_TABLET | Freq: Every day | ORAL | 0 refills | Status: AC
Start: 2024-09-14 — End: ?
  Filled 2024-09-14: qty 30, 30d supply, fill #0

## 2024-09-14 MED ORDER — DOXYCYCLINE HYCLATE 100 MG PO TABS
100.0000 mg | ORAL_TABLET | Freq: Two times a day (BID) | ORAL | 0 refills | Status: AC
  Filled 2024-09-14: qty 56, 28d supply, fill #0

## 2024-09-14 MED ORDER — TRAZODONE HCL 50 MG PO TABS
50.0000 mg | ORAL_TABLET | Freq: Every evening | ORAL | 0 refills | Status: AC | PRN
  Filled 2024-09-14: qty 15, 15d supply, fill #0

## 2024-09-14 MED ORDER — BUPRENORPHINE HCL-NALOXONE HCL 8-2 MG SL SUBL
1.0000 | SUBLINGUAL_TABLET | Freq: Two times a day (BID) | SUBLINGUAL | 0 refills | Status: AC
  Filled 2024-09-14: qty 10, 5d supply, fill #0

## 2024-09-14 MED ORDER — MIRTAZAPINE 15 MG PO TABS
15.0000 mg | ORAL_TABLET | Freq: Every day | ORAL | 0 refills | Status: AC
  Filled 2024-09-14: qty 30, 30d supply, fill #0

## 2024-09-14 MED ORDER — NICOTINE POLACRILEX 2 MG MT GUM
2.0000 mg | CHEWING_GUM | OROMUCOSAL | 0 refills | Status: AC | PRN
  Filled 2024-09-14: qty 110, 30d supply, fill #0

## 2024-09-14 NOTE — Progress Notes (Signed)
   09/14/24 0853  Psych Admission Type (Psych Patients Only)  Admission Status Involuntary  Psychosocial Assessment  Patient Complaints Substance abuse  Eye Contact Poor  Facial Expression Flat  Affect Anxious  Speech Slow  Interaction Minimal  Motor Activity Fidgety  Appearance/Hygiene Disheveled  Behavior Characteristics Anxious  Mood Depressed;Anxious  Thought Process  Coherency WDL  Content WDL  Delusions None reported or observed  Perception WDL  Hallucination None reported or observed  Judgment Impaired  Confusion None  Danger to Self  Current suicidal ideation? Denies  Description of Suicide Plan No plan  Agreement Not to Harm Self Yes  Description of Agreement Pt verbally contracts for safety  Danger to Others  Danger to Others None reported or observed
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
## 2024-09-14 NOTE — Plan of Care (Signed)
   Problem: Education: Goal: Knowledge of [facility] Education information/materials will improve Outcome: Progressing Goal: Verbalization of understanding the information provided will improve Outcome: Progressing

## 2024-09-14 NOTE — Progress Notes (Signed)
(  Sleep Hours) -13.0 (Any PRNs that were needed, meds refused, or side effects to meds)- prn hydroxyzine and trazodone @ 2055 (Any disturbances and when (visitation, over night)-none (Concerns raised by the patient)- none (SI/HI/AVH)- denies all

## 2024-09-14 NOTE — Progress Notes (Addendum)
  [facility] Adult Case Management Discharge Plan :  Will you be returning to the same living situation after discharge:  No.  Patient will go to her sister's home At discharge, do you have transportation home?: Yes,  patient's mom will pick her up at 10:30 AM Do you have the ability to pay for your medications: No.  Release of information consent forms completed and in the chart;  Patient's signature needed at discharge.  Patient to Follow up at:  Follow-up Information     [facility]. Go on 09/15/2024.   Why: You have a pre-admit appointment on 09/15/24 at 12:30 pm, to assess for suboxone treatment services. This appointment will be one to two hours. At this time, you will be scheduled for an intake appointment, and medication services.  * Please bring photo ID and insurance card. Contact information: [address] [place], [place] [address] P: [phone] (Office phone) New patients call: [phone] Dosing hours: M-F from 5:30 am to 11:30 am, and Sat. 5:30 am to 7:30 am.        [facility]. Go on 09/20/2024.   Specialty: Behavioral Health Why: You have an appointment on 09/20/24 at 1:00 pm.  The substance abuse intensive outpatient therapy program (SAIOP) meets in-person M, W, F from 9 am to 12 pm and runs for 8 to 12 weeks.  Clients can also receive individual and family therapy and MAT (medication assisted treatment).  There is weekly drug testing.  The program is abstinence-based and AA, NA, [facility], etc. attendance is encouraged.  For questions, please call [phone]. Contact information: [address] [place]  [address] [phone]                Next level of care provider has access to [facility] Link:no  Safety Planning and Suicide Prevention discussed: Yes,  Donnesha Karg (sister) [phone] and Dhyana Bastone (mom) [phone]  Has patient been referred to the  Quitline?: Patient refused referral for treatment  Patient has been referred for addiction treatment:  Yes, the patient will follow up with an outpatient provider for substance use disorder.   [facility] (suboxone) - appointment was scheduled for 09/15/2024.  Substance Use Intensive Outpatient Program (SAIOP) - appointment was scheduled for 09/20/2024.    Myishia Kasik O Neta Upadhyay, LCSWA 09/14/2024, 9:44 AM

## 2024-09-14 NOTE — Discharge Instructions (Signed)
 As discussed, please follow up with the below practice.  [facility] [address],  [place]  [address]  Get Driving Directions Main: [phone]  Fax: [phone]

## 2024-09-14 NOTE — Discharge Summary (Signed)
 Physician Discharge Summary Note  Patient:  Kelly [name] is an 23 y.o., female MRN:  [id number] DOB:  Mar 18, 2001 Patient phone:  [phone] (home)  Patient address:   [address] [place] [facility] [address],  Total Time spent with patient: 15 min  Date of Admission:  09/09/2024 Date of Discharge: 09/14/2024  Reason for Admission:   The patient is a 24 year old female with a prior history of 1 psychiatric hospitalization as an adolescent for DMDD. She presented under involuntary commitment, paperwork filled out by her sister, who reported increasing substance use and concern for suicidal behavior. She is currently admitted to the [facility] behavioral health hospital on an involuntary basis.   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   GERD (gastroesophageal reflux disease)    Gonorrhea    Seizures (HCC)    one seizure August 2016   Vomiting     Past Surgical History:  Procedure Laterality Date   tubes in ears     Family History:  Family History  Problem Relation Age of Onset   Healthy Mother    Cancer Maternal Uncle    Family Psychiatric  History: See H and P Social History:  Social History   Substance and Sexual Activity  Alcohol Use No     Social History   Substance and Sexual Activity  Drug Use Yes   Types: Marijuana   Comment: Occassionally    Social History    Socioeconomic History   Marital status: Single    Spouse name: Not on file   Number of children: Not on file   Years of education: Not on file   Highest education level: Not on file  Occupational History   Not on file  Tobacco Use   Smoking status: Every Day    Current packs/day: 0.00    Types: Cigarettes, E-cigarettes    Last attempt to quit: 05/25/2016    Years since quitting: 8.3   Smokeless tobacco: Never  Vaping Use   Vaping status: Never Used  Substance and Sexual Activity   Alcohol use: No   Drug use: Yes    Types: Marijuana    Comment: Occassionally   Sexual activity: Yes    Birth control/protection: None    Comment: Last encounter: Mid October 2024  Other Topics Concern   Not on file  Social History Narrative   Not on file   Social Drivers of Health   Financial Resource Strain: Low Risk  (04/06/2022)   Overall Financial Resource Strain (CARDIA)    Difficulty of Paying Living Expenses: Not very hard  Food Insecurity: Food Insecurity Present (09/09/2024)   Hunger Vital Sign    Worried About Running Out of Food in the Last Year: Sometimes true    Ran Out of Food in the Last Year: Sometimes true  Transportation Needs: Unmet Transportation Needs (09/09/2024)   PRAPARE - Administrator, Civil Service (Medical): Yes    Lack  of Transportation (Non-Medical): Yes  Physical Activity: Insufficiently Active (04/06/2022)   Exercise Vital Sign    Days of Exercise per Week: 3 days    Minutes of Exercise per Session: 20 min  Stress: Stress Concern Present (04/06/2022)   [employer] of Occupational Health - Occupational Stress Questionnaire    Feeling of Stress : To some extent  Social Connections: Socially Isolated (04/06/2022)   Social Connection and Isolation Panel    Frequency of Communication with Friends and Family: Once a week    Frequency of Social Gatherings with Friends and Family: Never    Attends Religious Services: Never    Database administrator  or Organizations: No    Attends Engineer, structural: Never    Marital Status: Never married    Hospital Course:   During the patient's hospitalization, patient had extensive initial psychiatric evaluation, and follow-up psychiatric evaluations every day.  Upon evaluation, psychiatric diagnoses were given as follows:  Opioid use disorder, severe Marijuana abuse Benzodiazepine abuse Cocaine use disorder, moderate Substance-induced mood disorder  Patient's psychiatric medications were adjusted on admission:  Abilify  2 mg started for mood stabilization  During the hospitalization, other adjustments were made to the patient's psychiatric medication regimen:  Abilify  2 mg increased to 5 mg daily Remeron 15 mg nightly added for sleep and mood Suboxone started and increased to 8 mg twice daily, which the patient tolerated well [facility] at 2 PM.  Discussed this appointment multiple times with the patient and she agrees to follow-up. 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 pallidum antibody positive.  Consulted with infectious disease.  Recommended full treatment for syphilis.  Discussed in detail with the patient.  Started on 28-day course of doxycycline  due to penicillin  shortage.  She was given information to follow-up with [facility] for infectious disease.  She agreed to follow-up.  Gradually, patient started adjusting to milieu.   Patient's care was discussed during the interdisciplinary team meeting every day during the hospitalization.  The patient denied having side effects to prescribed psychiatric medication.  The patient reports their target psychiatric symptoms responded well to the psychiatric medications, and the patient reports overall benefit other psychiatric hospitalization. Supportive psychotherapy was provided to the patient. The patient also participated in regular group therapy while admitted.   Labs were reviewed with  the patient, and abnormal results were discussed with the patient.  The patient denied having suicidal thoughts more than 48 hours prior to discharge.  Patient denies having homicidal thoughts.  Patient denies having auditory hallucinations.  Patient denies any visual hallucinations.  Patient denies having paranoid thoughts.  The patient is able to verbalize their individual safety plan to this provider.  It is recommended to the patient to continue psychiatric medications as prescribed, after discharge from the hospital.    It is recommended to the patient to follow up with your outpatient psychiatric provider and PCP.  Discussed with the patient, the impact of alcohol, drugs, tobacco have been there overall psychiatric and medical wellbeing, and total abstinence from substance use was recommended the patient.    Disposition: to home/self care   Physical Findings: AIMS:   ,  ,   ,   ,      Psychiatric Specialty Exam: Physical Exam Constitutional:      Appearance: the patient is not toxic-appearing.  Pulmonary:     Effort: Pulmonary effort is normal.  Neurological:     General: No  focal deficit present.     Mental Status: the patient is alert and oriented to person, place, and time.   Review of Systems  Respiratory:  Negative for shortness of breath.   Cardiovascular:  Negative for chest pain.  Gastrointestinal:  Negative for abdominal pain, constipation, diarrhea, nausea and vomiting.  Neurological:  Negative for headaches.      BP 109/68 (BP Location: Right Arm)   Pulse (!) 125   Temp (!) 97.3 F (36.3 C) (Oral)   Resp 18   Ht 5' 6 (1.676 m)   Wt 50.9 kg   SpO2 100%   BMI 18.11 kg/m   General Appearance: Fairly Groomed  Eye Contact:  Good  Speech:  Clear and Coherent  Volume:  Normal  Mood:  Euthymic  Affect:  Congruent  Thought Process:  Coherent  Orientation:  Full (Time, Place, and Person)  Thought Content: Logical   Suicidal Thoughts:  No  Homicidal  Thoughts:  No  Memory:  Immediate;   Good  Judgement:  fair  Insight:  fair  Psychomotor Activity:  Normal  Concentration:  Concentration: Good  Recall:  Good  Fund of Knowledge: Good  Language: Good  Akathisia:  No  Handed:  not assessed  AIMS (if indicated): not done  Assets:  Communication Skills Desire for Improvement Financial Resources/Insurance Housing Leisure Time Physical Health  ADL's:  Intact  Cognition: WNL  Sleep:  Fair      Social History   Tobacco Use  Smoking Status Every Day   Current packs/day: 0.00   Types: Cigarettes, E-cigarettes   Last attempt to quit: 05/25/2016   Years since quitting: 8.3  Smokeless Tobacco Never   Tobacco Cessation:  A prescription for an FDA-approved tobacco cessation medication provided at discharge   Blood Alcohol level:  Lab Results  Component Value Date   [facility] <15 09/06/2024   ETH <5 11/11/2015    Metabolic Disorder Labs:  Lab Results  Component Value Date   HGBA1C 4.6 (L) 09/10/2024   MPG 85.32 09/10/2024   No results found for: PROLACTIN Lab Results  Component Value Date   CHOL 150 09/10/2024   TRIG 92 09/10/2024   HDL 42 09/10/2024   CHOLHDL 3.6 09/10/2024   VLDL 18 09/10/2024   LDLCALC 90 09/10/2024    See Psychiatric Specialty Exam and Suicide Risk Assessment completed by Attending Physician prior to discharge.  Discharge destination: self-care  Is patient on multiple antipsychotic therapies at discharge:  no Has Patient had three or more failed trials of antipsychotic monotherapy by history:  no  Recommended Plan for Multiple Antipsychotic Therapies: NA  Discharge Instructions     Diet - low sodium heart healthy   Complete by: As directed    Increase activity slowly   Complete by: As directed       Allergies as of 09/14/2024   No Known Allergies      Medication List     STOP taking these medications    ipratropium 0.03 % nasal spray Commonly known as: ATROVENT         TAKE these medications      Indication  ARIPiprazole  5 MG tablet Commonly known as: ABILIFY  Take 1 tablet (5 mg total) by mouth daily.  Indication: Major Depressive Disorder   buprenorphine-naloxone 8-2 mg Subl SL tablet Commonly known as: SUBOXONE Place 1 tablet under the tongue 2 (two) times daily for 5 days.  Indication: Opioid Dependence   doxycycline  100 MG tablet Commonly known as: VIBRA -TABS  Take 1 tablet (100 mg total) by mouth 2 (two) times daily with a meal for 28 days.  Indication: Syphilis   mirtazapine 15 MG tablet Commonly known as: REMERON Take 1 tablet (15 mg total) by mouth at bedtime.  Indication: Major Depressive Disorder   nicotine polacrilex 2 MG gum Commonly known as: NICORETTE Take 1 each (2 mg total) by mouth as needed for smoking cessation.  Indication: Nicotine Addiction   traZODone 50 MG tablet Commonly known as: DESYREL Take 1 tablet (50 mg total) by mouth at bedtime as needed for sleep.  Indication: Trouble Sleeping        Follow-up Information     [facility]. Go on 09/15/2024.   Why: You have a pre-admit appointment on 09/15/24 at 12:30 pm, to assess for suboxone treatment services. This appointment will be one to two hours. At this time, you will be scheduled for an intake appointment, and medication services.  * Please bring photo ID and insurance card. Contact information: [address] [place], [place] [address] P: [phone] (Office phone) New patients call: [phone] Dosing hours: M-F from 5:30 am to 11:30 am, and Sat. 5:30 am to 7:30 am.        [facility]. Go on 09/20/2024.   Specialty: Behavioral Health Why: You have an appointment on 09/20/24 at 1:00 pm.  The substance abuse intensive outpatient therapy program (SAIOP) meets in-person M, W, F from 9 am to 12 pm and runs for 8 to 12 weeks.  Clients can also receive individual and family therapy and  MAT (medication assisted treatment).  There is weekly drug testing.  The program is abstinence-based and AA, NA, [facility], etc. attendance is encouraged.  For questions, please call [phone]. Contact information: [address] [place]  [address] [phone]                Follow-up recommendations:  Activity as tolerated. Diet as recommended by PCP. Keep all scheduled follow-up appointments as recommended.  Patient is instructed to take all prescribed medications as recommended. Report any side effects or adverse reactions to your outpatient psychiatrist. Patient is instructed to abstain from alcohol and illegal drugs while on prescription medications. In the event of worsening symptoms, patient is instructed to call the crisis hotline, 911, or go to the nearest emergency department for evaluation and treatment.  Prescriptions given at discharge. Patient agreeable to plan. Given opportunity to ask questions. Appears to feel comfortable with discharge.  Patient is also instructed prior to discharge to: Take all medications as prescribed by mental healthcare provider. Report any adverse effects and or reactions from the medicines to outpatient provider promptly. Patient has been instructed & cautioned: To not engage in alcohol and or illegal drug use while on prescription medicines. In the event of worsening symptoms,  patient is instructed to call the crisis hotline, 911 and or go to the nearest ED for appropriate evaluation and treatment of symptoms. To follow-up with primary care provider for other medical issues, concerns and or health care needs  The patient was evaluated each day by a clinical provider to ascertain response to treatment. Improvement was noted by the patient's report of decreasing symptoms, improved sleep and appetite, affect, medication tolerance, behavior, and participation in unit programming.  Patient was asked each day to complete a self  inventory noting mood, mental status, pain, new symptoms, anxiety and concerns.  Patient responded well to medication and being  in a therapeutic and supportive environment. Positive and appropriate behavior was noted and the patient was motivated for recovery. The patient worked closely with the treatment team and case manager to develop a discharge plan with appropriate goals. Coping skills, problem solving as well as relaxation therapies were also part of the unit programming.  By the day of discharge patient was in much improved condition than upon admission.  Symptoms were reported as significantly decreased or resolved completely. The patient was motivated to continue taking medication with a goal of continued improvement in mental health.    Comments:  As above  Signed: Karleen Kaufmann, MD PGY-4

## 2024-09-14 NOTE — Progress Notes (Signed)
 Patient discharged from [facility] on 09/14/24 at 0950. Patient denies SI, plan, and intention. Suicide safety plan completed, reviewed with this RN, given to the patient, and a copy in the chart. Patient is alert, oriented, and cooperative. RN provided patient with discharge paperwork and reviewed information with patient. Patient expressed that she understood all of the discharge instructions. Pt was satisfied with belongings returned to her from the locker and at bedside. Discharged patient to [facility] waiting room. Pt's mother awaiting patient in the [facility] waiting room.

## 2024-09-15 ENCOUNTER — Other Ambulatory Visit (HOSPITAL_COMMUNITY): Payer: Self-pay

## 2024-09-20 ENCOUNTER — Ambulatory Visit (HOSPITAL_COMMUNITY): Payer: MEDICAID

## 2024-09-20 ENCOUNTER — Telehealth (HOSPITAL_COMMUNITY): Payer: Self-pay | Admitting: Licensed Clinical Social Worker

## 2024-09-20 NOTE — Telephone Encounter (Signed)
 The therapist attempts to reach [facility] but receives a message saying that the call cannot be completed at this time.  Zell Maier, MA, LCSW, [facility], LCAS 09/20/2024

## 2024-09-21 ENCOUNTER — Encounter (HOSPITAL_COMMUNITY): Payer: Self-pay

## 2024-09-25 ENCOUNTER — Inpatient Hospital Stay: Payer: MEDICAID | Admitting: Internal Medicine
